# Patient Record
Sex: Female | Born: 1940 | Race: White | Hispanic: No
Health system: Southern US, Community
[De-identification: ages and names within clinical notes are randomized; demographics above are authoritative.]

---

## 2009-01-31 ENCOUNTER — Ambulatory Visit: Payer: Self-pay | Admitting: Family Medicine

## 2009-07-26 ENCOUNTER — Ambulatory Visit: Payer: Self-pay | Admitting: Family Medicine

## 2009-10-20 ENCOUNTER — Ambulatory Visit: Payer: Self-pay | Admitting: Orthopedic Surgery

## 2009-11-15 ENCOUNTER — Ambulatory Visit: Payer: Self-pay | Admitting: Vascular Surgery

## 2009-11-22 ENCOUNTER — Inpatient Hospital Stay: Payer: Self-pay | Admitting: Vascular Surgery

## 2010-01-04 ENCOUNTER — Ambulatory Visit: Payer: Self-pay | Admitting: Vascular Surgery

## 2010-02-06 ENCOUNTER — Ambulatory Visit: Payer: Self-pay | Admitting: Vascular Surgery

## 2010-02-21 ENCOUNTER — Ambulatory Visit: Payer: Self-pay | Admitting: Vascular Surgery

## 2010-03-01 ENCOUNTER — Ambulatory Visit: Payer: Self-pay | Admitting: Vascular Surgery

## 2010-03-08 ENCOUNTER — Ambulatory Visit: Payer: Self-pay | Admitting: Vascular Surgery

## 2010-03-10 LAB — PATHOLOGY REPORT

## 2010-04-10 ENCOUNTER — Ambulatory Visit: Payer: Self-pay | Admitting: Family Medicine

## 2010-04-14 ENCOUNTER — Ambulatory Visit: Payer: Self-pay | Admitting: Vascular Surgery

## 2010-05-17 ENCOUNTER — Other Ambulatory Visit: Payer: Self-pay | Admitting: Nephrology

## 2010-08-22 ENCOUNTER — Other Ambulatory Visit: Payer: Self-pay | Admitting: Nephrology

## 2010-10-07 ENCOUNTER — Emergency Department: Payer: Self-pay | Admitting: Emergency Medicine

## 2010-10-25 ENCOUNTER — Other Ambulatory Visit: Payer: Self-pay | Admitting: Orthopedic Surgery

## 2011-10-08 ENCOUNTER — Ambulatory Visit: Payer: Self-pay

## 2012-01-30 ENCOUNTER — Other Ambulatory Visit: Payer: Self-pay | Admitting: Nephrology

## 2012-01-30 LAB — RENAL FUNCTION PANEL
BUN: 13 mg/dL (ref 7–18)
Calcium, Total: 8.3 mg/dL — ABNORMAL LOW (ref 8.5–10.1)
EGFR (African American): 36 — ABNORMAL LOW
Glucose: 87 mg/dL (ref 65–99)
Osmolality: 281 (ref 275–301)
Potassium: 5 mmol/L (ref 3.5–5.1)
Sodium: 141 mmol/L (ref 136–145)

## 2012-01-30 LAB — CBC WITH DIFFERENTIAL/PLATELET
Basophil %: 1.3 %
Eosinophil #: 0.5 10*3/uL (ref 0.0–0.7)
Eosinophil %: 8 %
HCT: 35.1 % (ref 35.0–47.0)
HGB: 11.3 g/dL — ABNORMAL LOW (ref 12.0–16.0)
Lymphocyte #: 1.2 10*3/uL (ref 1.0–3.6)
MCH: 29.1 pg (ref 26.0–34.0)
Neutrophil #: 3.4 10*3/uL (ref 1.4–6.5)
Platelet: 227 10*3/uL (ref 150–440)
RBC: 3.89 10*6/uL (ref 3.80–5.20)
RDW: 14.8 % — ABNORMAL HIGH (ref 11.5–14.5)
WBC: 5.7 10*3/uL (ref 3.6–11.0)

## 2012-01-30 LAB — URINALYSIS, COMPLETE
Bacteria: NONE SEEN
Blood: NEGATIVE
Ketone: NEGATIVE
Ph: 6 (ref 4.5–8.0)
Squamous Epithelial: <1
WBC UR: NONE SEEN /HPF (ref 0–5)

## 2012-01-30 LAB — PROTEIN / CREATININE RATIO, URINE: Creatinine, Urine: 45.5 mg/dL (ref 30.0–125.0)

## 2012-02-21 ENCOUNTER — Inpatient Hospital Stay: Payer: Self-pay | Admitting: Internal Medicine

## 2012-02-21 LAB — COMPREHENSIVE METABOLIC PANEL
Albumin: 3.1 g/dL — ABNORMAL LOW (ref 3.4–5.0)
Anion Gap: 6 — ABNORMAL LOW (ref 7–16)
BUN: 13 mg/dL (ref 7–18)
Calcium, Total: 8.6 mg/dL (ref 8.5–10.1)
Co2: 33 mmol/L — ABNORMAL HIGH (ref 21–32)
Creatinine: 1.22 mg/dL (ref 0.60–1.30)
EGFR (African American): 52 — ABNORMAL LOW
EGFR (Non-African Amer.): 45 — ABNORMAL LOW
Glucose: 120 mg/dL — ABNORMAL HIGH (ref 65–99)
Osmolality: 281 (ref 275–301)
SGOT(AST): 25 U/L (ref 15–37)
SGPT (ALT): 21 U/L
Sodium: 140 mmol/L (ref 136–145)

## 2012-02-21 LAB — APTT: Activated PTT: 39.9 secs — ABNORMAL HIGH (ref 23.6–35.9)

## 2012-02-21 LAB — CK TOTAL AND CKMB (NOT AT ARMC): CK-MB: 7.5 ng/mL — ABNORMAL HIGH (ref 0.5–3.6)

## 2012-02-21 LAB — CBC
HGB: 12 g/dL (ref 12.0–16.0)
RBC: 4.09 10*6/uL (ref 3.80–5.20)

## 2012-02-21 LAB — TSH: Thyroid Stimulating Horm: 2.6 u[IU]/mL

## 2012-02-21 LAB — TROPONIN I
Troponin-I: 0.89 ng/mL — ABNORMAL HIGH
Troponin-I: 1.5 ng/mL — ABNORMAL HIGH

## 2012-02-21 LAB — PROTIME-INR
INR: 1.1
Prothrombin Time: 14.2 secs (ref 11.5–14.7)

## 2012-02-21 LAB — PRO B NATRIURETIC PEPTIDE: B-Type Natriuretic Peptide: 1344 pg/mL — ABNORMAL HIGH (ref 0–125)

## 2012-02-22 LAB — CBC WITH DIFFERENTIAL/PLATELET
Basophil #: 0.1 10*3/uL (ref 0.0–0.1)
Eosinophil #: 0.1 10*3/uL (ref 0.0–0.7)
HGB: 10.6 g/dL — ABNORMAL LOW (ref 12.0–16.0)
Lymphocyte #: 0.7 10*3/uL — ABNORMAL LOW (ref 1.0–3.6)
MCH: 29.2 pg (ref 26.0–34.0)
MCHC: 32.9 g/dL (ref 32.0–36.0)
Monocyte %: 9.1 %
Neutrophil %: 77.9 %
RBC: 3.63 10*6/uL — ABNORMAL LOW (ref 3.80–5.20)
RDW: 14.8 % — ABNORMAL HIGH (ref 11.5–14.5)
WBC: 6.7 10*3/uL (ref 3.6–11.0)

## 2012-02-22 LAB — LIPID PANEL
Cholesterol: 129 mg/dL (ref 0–200)
Ldl Cholesterol, Calc: 58 mg/dL (ref 0–100)
Triglycerides: 40 mg/dL (ref 0–200)

## 2012-02-22 LAB — APTT: Activated PTT: >160 secs (ref 23.6–35.9)

## 2012-02-22 LAB — BASIC METABOLIC PANEL
Calcium, Total: 8.1 mg/dL — ABNORMAL LOW (ref 8.5–10.1)
Creatinine: 1.28 mg/dL (ref 0.60–1.30)

## 2012-02-22 LAB — MAGNESIUM: Magnesium: 2 mg/dL

## 2012-02-23 ENCOUNTER — Ambulatory Visit: Payer: Self-pay | Admitting: Internal Medicine

## 2012-02-23 LAB — BASIC METABOLIC PANEL
EGFR (African American): 52 — ABNORMAL LOW
EGFR (Non-African Amer.): 45 — ABNORMAL LOW
Glucose: 80 mg/dL (ref 65–99)
Sodium: 140 mmol/L (ref 136–145)

## 2012-02-25 ENCOUNTER — Ambulatory Visit: Payer: Self-pay | Admitting: Internal Medicine

## 2012-02-26 LAB — PROTIME-INR: INR: 1.1

## 2012-03-03 ENCOUNTER — Ambulatory Visit: Payer: Self-pay | Admitting: Internal Medicine

## 2012-03-06 LAB — PATHOLOGY REPORT

## 2012-03-27 ENCOUNTER — Ambulatory Visit: Payer: Self-pay | Admitting: Internal Medicine

## 2012-04-13 ENCOUNTER — Emergency Department: Payer: Self-pay | Admitting: Emergency Medicine

## 2012-04-13 LAB — CBC
HCT: 32.9 % — ABNORMAL LOW (ref 35.0–47.0)
HGB: 10.7 g/dL — ABNORMAL LOW (ref 12.0–16.0)
MCV: 89 fL (ref 80–100)
Platelet: 223 10*3/uL (ref 150–440)
RBC: 3.7 10*6/uL — ABNORMAL LOW (ref 3.80–5.20)
RDW: 15 % — ABNORMAL HIGH (ref 11.5–14.5)
WBC: 7.4 10*3/uL (ref 3.6–11.0)

## 2012-04-13 LAB — COMPREHENSIVE METABOLIC PANEL
Albumin: 2.9 g/dL — ABNORMAL LOW (ref 3.4–5.0)
Alkaline Phosphatase: 100 U/L (ref 50–136)
BUN: 19 mg/dL — ABNORMAL HIGH (ref 7–18)
Calcium, Total: 8.5 mg/dL (ref 8.5–10.1)
Co2: 37 mmol/L — ABNORMAL HIGH (ref 21–32)
SGOT(AST): 22 U/L (ref 15–37)
SGPT (ALT): 16 U/L (ref 12–78)
Sodium: 140 mmol/L (ref 136–145)

## 2012-04-13 LAB — CK TOTAL AND CKMB (NOT AT ARMC): CK-MB: 3.4 ng/mL (ref 0.5–3.6)

## 2012-04-15 LAB — CBC CANCER CENTER
Basophil #: 0 x10 3/mm (ref 0.0–0.1)
Eosinophil #: 0.2 x10 3/mm (ref 0.0–0.7)
HCT: 33.1 % — ABNORMAL LOW (ref 35.0–47.0)
Lymphocyte #: 0.6 x10 3/mm — ABNORMAL LOW (ref 1.0–3.6)
MCH: 28.8 pg (ref 26.0–34.0)
MCHC: 32.1 g/dL (ref 32.0–36.0)
MCV: 90 fL (ref 80–100)
Neutrophil #: 7.1 x10 3/mm — ABNORMAL HIGH (ref 1.4–6.5)
Neutrophil %: 84.3 %
RBC: 3.7 10*6/uL — ABNORMAL LOW (ref 3.80–5.20)
RDW: 14.9 % — ABNORMAL HIGH (ref 11.5–14.5)

## 2012-04-22 LAB — CBC CANCER CENTER
Eosinophil #: 0.2 x10 3/mm (ref 0.0–0.7)
HCT: 33.3 % — ABNORMAL LOW (ref 35.0–47.0)
MCH: 29 pg (ref 26.0–34.0)
MCHC: 32.7 g/dL (ref 32.0–36.0)
MCV: 89 fL (ref 80–100)
Monocyte #: 0.5 x10 3/mm (ref 0.2–0.9)
Monocyte %: 8.6 %
RDW: 14.9 % — ABNORMAL HIGH (ref 11.5–14.5)

## 2012-04-27 ENCOUNTER — Ambulatory Visit: Payer: Self-pay | Admitting: Internal Medicine

## 2012-04-29 LAB — CBC CANCER CENTER
Basophil %: 1.1 %
Eosinophil #: 0.2 x10 3/mm (ref 0.0–0.7)
Eosinophil %: 3.4 %
HCT: 33.8 % — ABNORMAL LOW (ref 35.0–47.0)
HGB: 11.1 g/dL — ABNORMAL LOW (ref 12.0–16.0)
Lymphocyte #: 0.5 x10 3/mm — ABNORMAL LOW (ref 1.0–3.6)
MCH: 29.1 pg (ref 26.0–34.0)
MCV: 89 fL (ref 80–100)
Monocyte #: 0.5 x10 3/mm (ref 0.2–0.9)
Neutrophil #: 4 x10 3/mm (ref 1.4–6.5)
RBC: 3.82 10*6/uL (ref 3.80–5.20)

## 2012-05-06 LAB — CBC CANCER CENTER
Basophil %: 1 %
Eosinophil #: 0.3 x10 3/mm (ref 0.0–0.7)
HCT: 33.1 % — ABNORMAL LOW (ref 35.0–47.0)
HGB: 10.6 g/dL — ABNORMAL LOW (ref 12.0–16.0)
Lymphocyte %: 8.7 %
MCHC: 32.1 g/dL (ref 32.0–36.0)
Monocyte %: 11.6 %
Neutrophil #: 4.3 x10 3/mm (ref 1.4–6.5)
Neutrophil %: 74.2 %
Platelet: 230 x10 3/mm (ref 150–440)
RBC: 3.72 10*6/uL — ABNORMAL LOW (ref 3.80–5.20)
WBC: 5.8 x10 3/mm (ref 3.6–11.0)

## 2012-05-13 LAB — CBC CANCER CENTER
Basophil %: 0.8 %
Eosinophil #: 0.3 x10 3/mm (ref 0.0–0.7)
Eosinophil %: 4.9 %
HGB: 11.4 g/dL — ABNORMAL LOW (ref 12.0–16.0)
Lymphocyte %: 10.2 %
MCH: 29.4 pg (ref 26.0–34.0)
MCHC: 33 g/dL (ref 32.0–36.0)
MCV: 89 fL (ref 80–100)
Monocyte %: 10.5 %
Neutrophil %: 73.6 %
Platelet: 243 x10 3/mm (ref 150–440)
RBC: 3.89 10*6/uL (ref 3.80–5.20)
RDW: 15.1 % — ABNORMAL HIGH (ref 11.5–14.5)

## 2012-05-20 LAB — CBC CANCER CENTER
Basophil #: 0.1 x10 3/mm (ref 0.0–0.1)
HGB: 11.1 g/dL — ABNORMAL LOW (ref 12.0–16.0)
Lymphocyte #: 0.5 x10 3/mm — ABNORMAL LOW (ref 1.0–3.6)
MCH: 28.1 pg (ref 26.0–34.0)
MCV: 89 fL (ref 80–100)
Monocyte #: 0.6 x10 3/mm (ref 0.2–0.9)
Monocyte %: 13.8 %
Neutrophil %: 69.7 %
Platelet: 236 x10 3/mm (ref 150–440)
RBC: 3.95 10*6/uL (ref 3.80–5.20)
RDW: 15.1 % — ABNORMAL HIGH (ref 11.5–14.5)
WBC: 4.6 x10 3/mm (ref 3.6–11.0)

## 2012-05-27 ENCOUNTER — Ambulatory Visit: Payer: Self-pay | Admitting: Internal Medicine

## 2012-05-27 LAB — CBC CANCER CENTER
Basophil %: 1.3 %
Eosinophil %: 6.2 %
HCT: 35.3 % (ref 35.0–47.0)
HGB: 11.1 g/dL — ABNORMAL LOW (ref 12.0–16.0)
Lymphocyte #: 0.5 x10 3/mm — ABNORMAL LOW (ref 1.0–3.6)
Lymphocyte %: 11.2 %
MCH: 28.3 pg (ref 26.0–34.0)
MCV: 90 fL (ref 80–100)
Monocyte %: 14 %
Neutrophil #: 2.7 x10 3/mm (ref 1.4–6.5)
RBC: 3.94 10*6/uL (ref 3.80–5.20)
WBC: 4.1 x10 3/mm (ref 3.6–11.0)

## 2012-06-27 ENCOUNTER — Ambulatory Visit: Payer: Self-pay | Admitting: Internal Medicine

## 2012-07-27 ENCOUNTER — Ambulatory Visit: Payer: Self-pay | Admitting: Internal Medicine

## 2012-07-30 ENCOUNTER — Ambulatory Visit: Payer: Self-pay | Admitting: Internal Medicine

## 2012-07-30 LAB — PLATELET COUNT: Platelet: 208 10*3/uL (ref 150–440)

## 2012-08-04 ENCOUNTER — Ambulatory Visit: Payer: Self-pay | Admitting: Internal Medicine

## 2012-08-08 ENCOUNTER — Inpatient Hospital Stay: Payer: Self-pay | Admitting: Internal Medicine

## 2012-08-08 LAB — CK TOTAL AND CKMB (NOT AT ARMC)
CK, Total: 108 U/L (ref 21–215)
CK-MB: 2.6 ng/mL (ref 0.5–3.6)

## 2012-08-08 LAB — BASIC METABOLIC PANEL
Anion Gap: 3 — ABNORMAL LOW (ref 7–16)
BUN: 19 mg/dL — ABNORMAL HIGH (ref 7–18)
Chloride: 102 mmol/L (ref 98–107)
EGFR (African American): 46 — ABNORMAL LOW
EGFR (Non-African Amer.): 39 — ABNORMAL LOW
Glucose: 98 mg/dL (ref 65–99)
Osmolality: 276 (ref 275–301)
Potassium: 4.6 mmol/L (ref 3.5–5.1)
Sodium: 137 mmol/L (ref 136–145)

## 2012-08-08 LAB — CBC
HCT: 33.5 % — ABNORMAL LOW (ref 35.0–47.0)
HGB: 10.7 g/dL — ABNORMAL LOW (ref 12.0–16.0)
MCV: 87 fL (ref 80–100)
RBC: 3.87 10*6/uL (ref 3.80–5.20)
RDW: 14.9 % — ABNORMAL HIGH (ref 11.5–14.5)
WBC: 7.3 10*3/uL (ref 3.6–11.0)

## 2012-08-08 LAB — TROPONIN I: Troponin-I: 0.06 ng/mL — ABNORMAL HIGH

## 2012-08-09 LAB — BASIC METABOLIC PANEL
Anion Gap: 4 — ABNORMAL LOW (ref 7–16)
BUN: 22 mg/dL — ABNORMAL HIGH (ref 7–18)
Co2: 32 mmol/L (ref 21–32)
Creatinine: 1.71 mg/dL — ABNORMAL HIGH (ref 0.60–1.30)
EGFR (Non-African Amer.): 30 — ABNORMAL LOW
Glucose: 128 mg/dL — ABNORMAL HIGH (ref 65–99)
Potassium: 5.4 mmol/L — ABNORMAL HIGH (ref 3.5–5.1)
Sodium: 138 mmol/L (ref 136–145)

## 2012-08-09 LAB — CBC WITH DIFFERENTIAL/PLATELET
Basophil #: 0.1 10*3/uL (ref 0.0–0.1)
Basophil %: 0.6 %
Eosinophil %: 0 %
HCT: 31.6 % — ABNORMAL LOW (ref 35.0–47.0)
HGB: 10.4 g/dL — ABNORMAL LOW (ref 12.0–16.0)
MCH: 29.1 pg (ref 26.0–34.0)
MCHC: 33.1 g/dL (ref 32.0–36.0)
MCV: 88 fL (ref 80–100)
Monocyte #: 0.6 x10 3/mm (ref 0.2–0.9)
Monocyte %: 5.3 %
Neutrophil #: 9.6 10*3/uL — ABNORMAL HIGH (ref 1.4–6.5)
Neutrophil %: 92.5 %
RDW: 14.9 % — ABNORMAL HIGH (ref 11.5–14.5)

## 2012-08-09 LAB — CK TOTAL AND CKMB (NOT AT ARMC): CK-MB: 4.1 ng/mL — ABNORMAL HIGH (ref 0.5–3.6)

## 2012-08-10 LAB — BASIC METABOLIC PANEL
Anion Gap: 5 — ABNORMAL LOW (ref 7–16)
EGFR (African American): 35 — ABNORMAL LOW
EGFR (Non-African Amer.): 30 — ABNORMAL LOW
Glucose: 140 mg/dL — ABNORMAL HIGH (ref 65–99)

## 2012-08-11 LAB — BASIC METABOLIC PANEL
Anion Gap: 4 — ABNORMAL LOW (ref 7–16)
Calcium, Total: 9.3 mg/dL (ref 8.5–10.1)
Chloride: 107 mmol/L (ref 98–107)
Co2: 30 mmol/L (ref 21–32)
Creatinine: 1.9 mg/dL — ABNORMAL HIGH (ref 0.60–1.30)
EGFR (Non-African Amer.): 26 — ABNORMAL LOW
Potassium: 6 mmol/L — ABNORMAL HIGH (ref 3.5–5.1)
Sodium: 141 mmol/L (ref 136–145)

## 2012-08-12 LAB — BASIC METABOLIC PANEL
Anion Gap: 7 (ref 7–16)
BUN: 30 mg/dL — ABNORMAL HIGH (ref 7–18)
Creatinine: 1.66 mg/dL — ABNORMAL HIGH (ref 0.60–1.30)
EGFR (African American): 36 — ABNORMAL LOW
EGFR (Non-African Amer.): 31 — ABNORMAL LOW
Glucose: 102 mg/dL — ABNORMAL HIGH (ref 65–99)

## 2012-08-12 LAB — CBC WITH DIFFERENTIAL/PLATELET
Basophil %: 0.1 %
Eosinophil %: 0 %
HGB: 9.4 g/dL — ABNORMAL LOW (ref 12.0–16.0)
Lymphocyte #: 0.1 10*3/uL — ABNORMAL LOW (ref 1.0–3.6)
MCH: 27.8 pg (ref 26.0–34.0)
MCHC: 32 g/dL (ref 32.0–36.0)
MCV: 87 fL (ref 80–100)
Monocyte #: 0.5 x10 3/mm (ref 0.2–0.9)
Monocyte %: 5 %
Neutrophil #: 8.5 10*3/uL — ABNORMAL HIGH (ref 1.4–6.5)
Neutrophil %: 93.6 %
Platelet: 231 10*3/uL (ref 150–440)
RBC: 3.36 10*6/uL — ABNORMAL LOW (ref 3.80–5.20)
WBC: 9.1 10*3/uL (ref 3.6–11.0)

## 2012-08-12 LAB — PHOSPHORUS: Phosphorus: 3.4 mg/dL (ref 2.5–4.9)

## 2012-08-13 LAB — BASIC METABOLIC PANEL
Anion Gap: 7 (ref 7–16)
BUN: 31 mg/dL — ABNORMAL HIGH (ref 7–18)
Chloride: 101 mmol/L (ref 98–107)
Co2: 34 mmol/L — ABNORMAL HIGH (ref 21–32)
Osmolality: 290 (ref 275–301)
Sodium: 142 mmol/L (ref 136–145)

## 2012-08-13 LAB — EXPECTORATED SPUTUM ASSESSMENT W GRAM STAIN, RFLX TO RESP C

## 2012-08-14 LAB — BASIC METABOLIC PANEL
Anion Gap: 8 (ref 7–16)
BUN: 34 mg/dL — ABNORMAL HIGH (ref 7–18)
Calcium, Total: 8.8 mg/dL (ref 8.5–10.1)
Co2: 33 mmol/L — ABNORMAL HIGH (ref 21–32)
Creatinine: 1.81 mg/dL — ABNORMAL HIGH (ref 0.60–1.30)
EGFR (African American): 32 — ABNORMAL LOW
EGFR (Non-African Amer.): 28 — ABNORMAL LOW
Glucose: 141 mg/dL — ABNORMAL HIGH (ref 65–99)
Potassium: 3.5 mmol/L (ref 3.5–5.1)
Sodium: 137 mmol/L (ref 136–145)

## 2012-08-14 LAB — CULTURE, BLOOD (SINGLE)

## 2012-08-15 LAB — BASIC METABOLIC PANEL
Anion Gap: 8 (ref 7–16)
BUN: 35 mg/dL — ABNORMAL HIGH (ref 7–18)
Chloride: 96 mmol/L — ABNORMAL LOW (ref 98–107)
Co2: 34 mmol/L — ABNORMAL HIGH (ref 21–32)
Creatinine: 1.73 mg/dL — ABNORMAL HIGH (ref 0.60–1.30)
EGFR (Non-African Amer.): 30 — ABNORMAL LOW
Potassium: 4.1 mmol/L (ref 3.5–5.1)

## 2012-08-16 LAB — BASIC METABOLIC PANEL
Anion Gap: 6 — ABNORMAL LOW (ref 7–16)
Calcium, Total: 8.5 mg/dL (ref 8.5–10.1)
Co2: 39 mmol/L — ABNORMAL HIGH (ref 21–32)
EGFR (African American): 32 — ABNORMAL LOW
EGFR (Non-African Amer.): 27 — ABNORMAL LOW
Glucose: 107 mg/dL — ABNORMAL HIGH (ref 65–99)
Osmolality: 280 (ref 275–301)
Sodium: 136 mmol/L (ref 136–145)

## 2012-08-17 LAB — CBC WITH DIFFERENTIAL/PLATELET
Basophil #: 0 10*3/uL (ref 0.0–0.1)
Eosinophil #: 0 10*3/uL (ref 0.0–0.7)
Lymphocyte %: 1.3 %
MCH: 27.2 pg (ref 26.0–34.0)
MCHC: 31.7 g/dL — ABNORMAL LOW (ref 32.0–36.0)
Monocyte #: 0.3 x10 3/mm (ref 0.2–0.9)
Neutrophil %: 95.6 %
RBC: 3.93 10*6/uL (ref 3.80–5.20)
RDW: 15.1 % — ABNORMAL HIGH (ref 11.5–14.5)
WBC: 9.9 10*3/uL (ref 3.6–11.0)

## 2012-08-17 LAB — BASIC METABOLIC PANEL
Anion Gap: 5 — ABNORMAL LOW (ref 7–16)
BUN: 37 mg/dL — ABNORMAL HIGH (ref 7–18)
Chloride: 92 mmol/L — ABNORMAL LOW (ref 98–107)
Creatinine: 1.88 mg/dL — ABNORMAL HIGH (ref 0.60–1.30)
EGFR (Non-African Amer.): 27 — ABNORMAL LOW
Osmolality: 286 (ref 275–301)
Potassium: 3.8 mmol/L (ref 3.5–5.1)
Sodium: 137 mmol/L (ref 136–145)

## 2012-08-19 LAB — BASIC METABOLIC PANEL
BUN: 39 mg/dL — ABNORMAL HIGH (ref 7–18)
Calcium, Total: 8.4 mg/dL — ABNORMAL LOW (ref 8.5–10.1)
Chloride: 91 mmol/L — ABNORMAL LOW (ref 98–107)
Co2: 39 mmol/L — ABNORMAL HIGH (ref 21–32)
Creatinine: 1.89 mg/dL — ABNORMAL HIGH (ref 0.60–1.30)
EGFR (African American): 31 — ABNORMAL LOW
Glucose: 173 mg/dL — ABNORMAL HIGH (ref 65–99)
Potassium: 3.5 mmol/L (ref 3.5–5.1)
Sodium: 135 mmol/L — ABNORMAL LOW (ref 136–145)

## 2012-08-21 LAB — CREATININE, SERUM
EGFR (African American): 37 — ABNORMAL LOW
EGFR (Non-African Amer.): 32 — ABNORMAL LOW

## 2012-08-22 LAB — PATHOLOGY REPORT

## 2012-08-27 ENCOUNTER — Ambulatory Visit: Payer: Self-pay | Admitting: Internal Medicine

## 2012-09-27 DEATH — deceased

## 2014-06-13 IMAGING — CT NM PET TUM IMG LTD AREA
1 of 5 series · 8 of 25 positions shown · non-contrast
Comparison: none

REASON FOR EXAM: right lung nodule
COMMENTS:

[Series 3: ct wb 3.0 b30f · axial · 3.0mm · 0.98mm/px · z∈[-1282,-606]mm · 8 of 433 slices shown]
[im 49/433  soft-tissue]
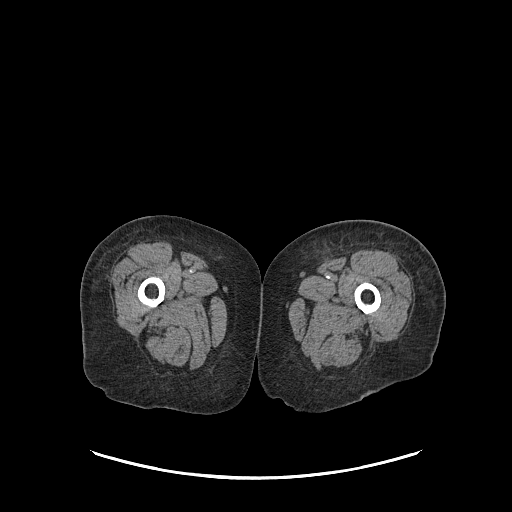
[im 97/433  soft-tissue]
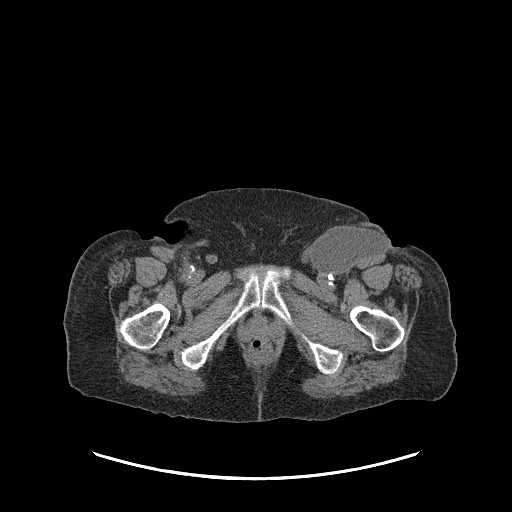
[im 145/433  soft-tissue]
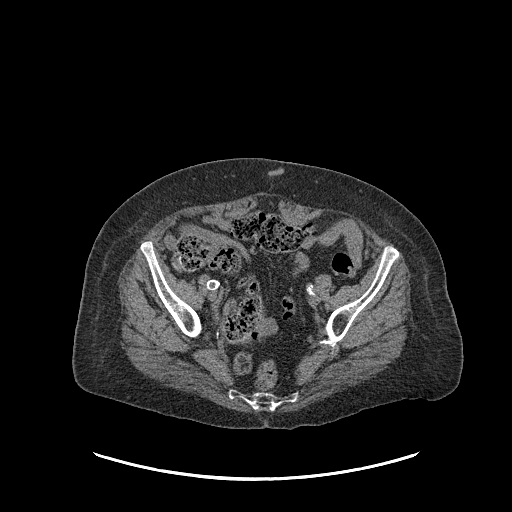
[im 193/433  soft-tissue]
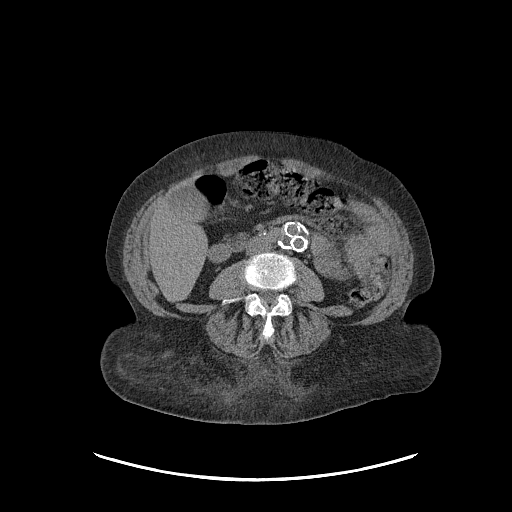
[im 241/433  soft-tissue]
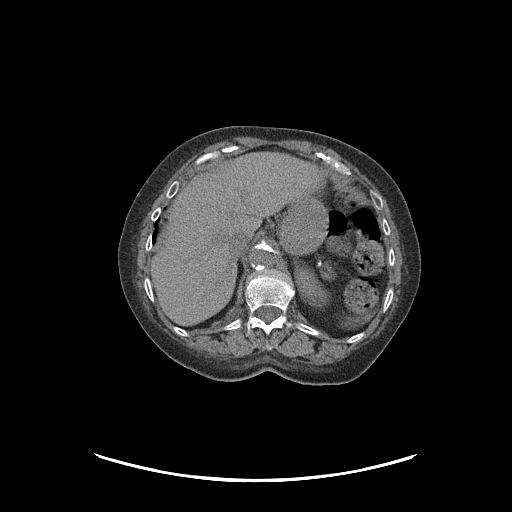
[im 289/433  soft-tissue]
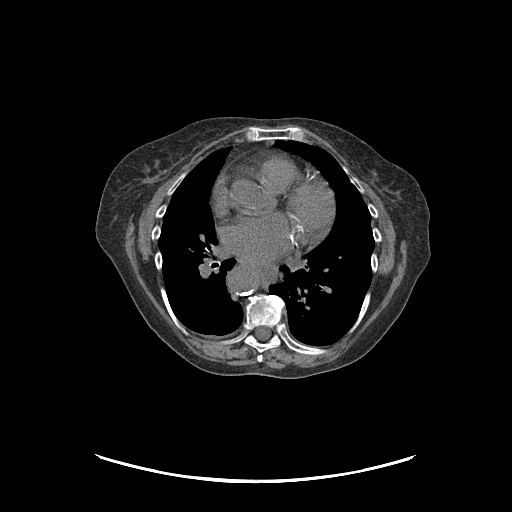
[im 337/433  soft-tissue]
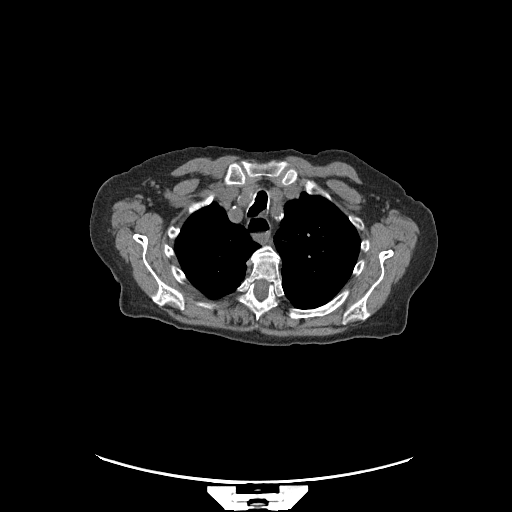
[im 385/433  soft-tissue]
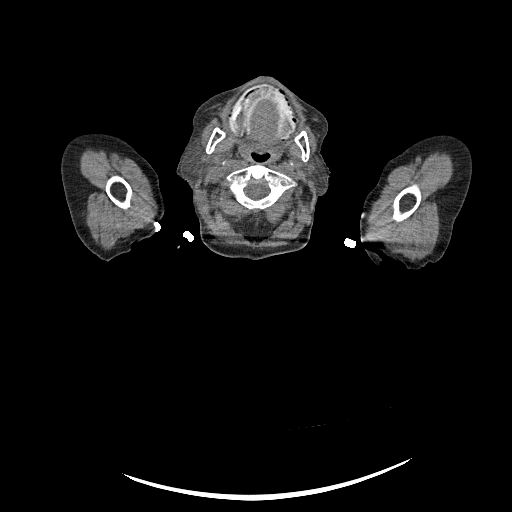

[8 of 25 positions shown; findings below may reference images not displayed]

PROCEDURE:     PET - PET/CT DX LUNG CA  - February 25, 2012 [DATE]

RESULT:

PROCEDURE:  Total body PET was performed in conjunction with a nonattenuated
CT status post right wrist injection of 12.15 mCi of F18-FDG.

The initial injection time was at [DATE], scan start time of [DATE] and
scan end time at [DATE].

Contamination on the patient's gown occurred during initial injection.
Delayed imaging was obtained status post removal of the contamination.

Scan start time was at [DATE] and scan end time at [DATE].

The patient's fasting blood glucose was 91 mg/dl.
FINDINGS: Appropriate bio-distribution is appreciated in the region of the
base of the brain, heart, liver, spleen, left kidney and urinary bladder.

There is minimal to no uptake in the region of the right kidney which is
severely atrophic.

A focal area of FDG avid hypermetabolic activity projects within the soft
tissue appearing density projecting either anteriorly within the superior
segment of the right lower lobe or possibly within the lateral segment of
the right middle lobe, with a SVU of 2.11. No further areas of abnormal
hypermetabolic activity are identified.
IMPRESSION: FDG advid nodular density within the right lung.
Differentialial considerations are neoplastic disease in the absence of
known infection or inflammatory processes.

## 2014-12-14 NOTE — Consult Note (Signed)
ONCOLOGY followup note -  HPI: weak, still has dyspnea at rest, on high flow O2.  Still having sputum mixed with blood. ROS: cough, mild right lower chest pain on coughing.  No fever today. Exam: weak, mildly tachypneic on speaking.  Otherwise no acute distress             vitals - 98, 87, 20, 154/75, 84-88% on high-flow oxygen             lungs - bilateral diminished breath sounds, more so in the right lower lobe             abd - mildly distended, soft, nontender Labs: creatinine 1.9, calcium 9.3. On 12/14, hemoglobin 10.4, WBC 10.4, platelets 219.  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  Agree with ongoing treatment for pneumonia and COPD, oxygen, and see if her condition improves in which case we can then pursue PET scan evaluation for staging.  Continue to monitor hemoptysis, if it worsens, will consider consulting radiation oncologist.  Will continue to follow.  Have discussed code status, at this time patient wants to remain full code.  Patient explained above, agreeable to this plan.  Electronic Signatures: Izola PricePandit, Tashiya Souders Raj (MD)  (Signed on 16-Dec-13 14:42)  Authored  Last Updated: 16-Dec-13 14:42 by Izola PricePandit, Harlon Kutner Raj (MD)

## 2014-12-14 NOTE — Consult Note (Signed)
Brief Consult Note: Diagnosis: CA LUNG, EXACCERBATION OF COPD, PNEUMONIA, CRF.   Patient was seen by consultant.   Consult note dictated.   Discussed with Attending MD.   Comments: PATIENT SEEN EVENING 12/15, NOTE DELAYED UNTIL TODAY, SEE ALSO DICTATED NOTE.  RE CA LUNG, PATIENT IS S/P XRT TO RUL NODULE FOR STAGE 1 CANCER, NOW HAS PERSISTENT HEMOPTYSIS AND NEW RLL NODULE. A PET/CT HAS BEEN PLANNED, LATER A BX  PLANNED.   SUGGEST.Eulah Citizen. WOULD CANCEL TOMORROWS PET/CT  PATIENT WILL NOT TOLERATE DUE TO RESP DISTRESS.  WOULD ALSO D/C ASA DUE TO HEMOPTYSIS.  CAN CONTINUE HEPARIN AS HIGH RISK CLOT BUT DIE TO RENAL FAILURE TID DOSE TOO HIGH, WOULD REDUCE TO Q 12H.  Electronic Signatures: Marin RobertsGittin, Hildreth Robart G (MD)  (Signed 15-Dec-13 12:04)  Authored: Brief Consult Note   Last Updated: 15-Dec-13 12:04 by Marin RobertsGittin, Lakendria Nicastro G (MD)

## 2014-12-14 NOTE — Consult Note (Signed)
ONCOLOGY followup - still weak and resting in bed, has dyspnea at rest, on high flow O2. Hemoptysis is much better.denies pain issues.  No fever today.weak, tachypneic on speaking.  Otherwise no acute distress            vitals - afebrile, stable, 93% on high-flow oxygen            lungs - bilateral diminished breath sounds, more so in the right lower lobe            abd - mildly distended, soft, nontendercreatinine 1.68. 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      Currently in with right lower lobe pneumonia/atelectasis, COPD exacerbation. Hemoptysis is better, still has remarkable dyspnea and weakness. Agree with ongoing treatment for pneumonia and COPD, oxygen, and see if her condition improves in which case we can then pursue PET scan evaluation for staging lung cancer. Also she is not a good candidate for any treatment for cancer unless her condition gets better. Continue to monitor for hemoptysis, if it worsens, consult radiation oncologist. Overall prognosis seems poor, Palliative care is now following. Will continue to follow intermittently. If discharged home will followup as outpatient.    Patient explained above, agreeable to this plan.  Electronic Signatures: Izola PricePandit, Andron Marrazzo Raj (MD)  (Signed on 19-Dec-13 00:14)  Authored  Last Updated: 19-Dec-13 00:14 by Izola PricePandit, Lucile Hillmann Raj (MD)

## 2014-12-14 NOTE — H&P (Signed)
PATIENT NAME:  Peggy [name], Peggy [name] MR#:  [id number] DATE OF BIRTH:  1941/07/20  DATE OF ADMISSION:  08/08/2012  PRIMARY ONCOLOGIST: Dr Sherrlyn HockPandit PRIMARY CARE PHYSICIAN: Dr Dossie Arbourrissman. PRIMARY CARDIOLOGY: Dr Juliann Paresallwood.  CHIEF COMPLAINT: Excruciating chest pain and shortness of breath.   HISTORY OF PRESENT ILLNESS: Very pleasant 74 year old female who was admitted June 2013 diagnosed with [name] non-ST elevation myocardial infarction. At that time, she underwent [name] cardiac catheterization which showed ostial RCA 100% stenosis. She also had an echo which was normal at that time and subsequently had [name] biopsy of her lung and was diagnosed with non-small cell carcinoma. She was brought in via EMS for excruciating chest pain and shortness of breath. In the emergency room, she was noted to have acute respiratory failure, was placed on BiPAP. Her chest pain was 10 out of 10.  She was describing as chest pain radiating to her back.  There was [name] high concern of dissection or some kind of aortic tear as the patient does have [name] history of aortic repair. After much discussion with the patient and the patient's family, due to her renal insufficiency, low GFR, it was decided by the family and the patient to go ahead and proceed with [name] CT scan to further evaluate for dissection versus pulmonary emboli. The patient has been under dialysis in the past and is well aware that she could undergo dialysis if getting contrast for the CT; however, she decided to undergo the CT. The CT did not show evidence of dissection. She is currently on [name] BiPAP with [name] slightly elevated troponin.   REVIEW OF SYSTEMS: CONSTITUTIONAL: She has had [name] low-grade fever and she has had fatigue and weakness status post XRT. EYES: She has occasional blurred vision, no double vision, no glaucoma. ENT: She hearing loss. No ear pain, epistaxis, discharge, snoring, redness of oropharynx. RESPIRATORY: Positive cough. Positive wheezing. Positive history of chronic obstructive  pulmonary disease, not on oxygen.  CARDIOVASCULAR: 10 out of 10 chest pain which has subsequently resolved. No orthopnea, edema, arrhythmia, palpitations, syncope.  GI: No nausea, vomiting, diarrhea, abdominal pain, melena, or ulcers. GU: No dysuria or hematuria. ENDOCRINE: No polyuria or polydipsia. HEME/LYMPH: No anemia or easy bruising.  SKIN: No rash or lesions. MUSCULOSKELETAL: No gout or pain in the shoulders or knees. NEUROLOGIC: No history of CVAs, transient ischemic attacks, seizures. PSYCHIATRY: She has no history of depression, anxiety.   PAST MEDICAL HISTORY:  1. Patient was admitted June 2013 with non-ST elevation myocardial infarction. She underwent [name] cardiac catheterization that showed [name] normal ejection fraction; however, she had over 100% ostial occlusion. [facility] and the cardiologist there according to the family recommended no further cardiac workup, just medical management for her ostial lesion.  2. 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 The patient actually underwent dialysis. She does have an AV fistula in the left arm.  3. Osteoarthritis. 4. Atrial fibrillation.  5. Peripheral vascular disease.    6. GERD.   7. Hypertension.   8. Chronic obstructive pulmonary disease, occasionally on home oxygen.  PAST SURGICAL HISTORY:  1. AV fistula left arm.  2. History of AAA repair.  3. Hemorrhoidectomy. 4. Cardiac stent.  5. Hysterectomy. 6. Appendectomy. 7. BTL.  8. Right toe amputation.    MEDICATIONS:  1. Tylenol 500 mg 1 to 2 tablets q.4 hours p.r.n. pain.  2. Aspirin 81 mg daily.  3. Tums 750 mg  2 tablets every 2 hours p.r.n.  4. Imdur 300 mg b.i.d.  5. Gabapentin 300 mg at bedtime.  6. Fluoxetine 40 mg daily.  7. Loperamide 2 mg p.r.n.  8. Cetirizine 10 mg daily.  9. Pravastatin 20 mg at bedtime.  10. Xanax 0.25 t.i.d. p.r.n.  11. Metoprolol 25, 1/2 tablet daily.   12. Albuterol inhaler q.6 hours.  13. ProAir 2 puffs q.6 hours.  14. Spiriva 18 mcg daily.  15. Symbicort 2 puffs b.i.d.  16. Dexamethasone eye drops 4 times [name] day as needed.  17. Nexium 40 mg daily.  18. Carbamide peroxide 6.5% optic solution 5 drops to each affected ear 3 times [name] day.  19. Hydralazine 50 mg t.i.d.  20. Nephro-Vite 1 tablet daily.   SOCIAL HISTORY: The patient continues to smoke although she says her last cigarette was yesterday. She smokes about [name] pack [name] day. She lives alone.    FAMILY HISTORY: Positive for coronary artery disease and diabetes.   ALLERGIES: No known drug allergies.   PHYSICAL EXAMINATION:   VITAL SIGNS: Temperature 98.2, pulse is 92, respirations 24, blood pressure 106/63, 92% on BiPAP.   GENERAL: The patient is sitting up. She is clenching her chest and appears to be in moderate distress.   HEENT: Head is atraumatic. Pupils are round. Sclerae anicteric. Mucous membranes were not examined due to patient being on BiPAP. Oropharynx is not examined either.   NECK: Supple. No appreciable thyromegaly.   CARDIOVASCULAR: Tachycardia. No murmurs, gallops, or rubs. PMI is not displaced.   LUNGS: Fair air movement. There is some mild wheezing, no rhonchi or crackles are heard.   BACK: No CVA or vertebral tenderness.  ABDOMEN: Bowel sounds positive, nontender, nondistended. No hepatosplenomegaly.   EXTREMITIES: No clubbing, cyanosis, or edema.  NEUROLOGIC: Cranial nerves 2 through 12 are grossly intact. No focal deficits.   SKIN: Without rash or lesions.   MUSCULOSKELETAL: 4 out of 5 strength in all extremities.   LABORATORIES: Sodium 137, potassium 4.6, chloride 102, bicarbonate 32, glucose is 98, BUN is 19, creatinine 1.37. BNP 2509. Troponin 0.06. White blood cell 7.3, hemoglobin 10.7, hematocrit 34, platelets are 220,000. D-dimer is 2.19. ABG 7.33, pCO2 is 61, pO2 is 337, FIO2 of 100, bicarbonate is 32.2. This is on nonrebreather.   CT scan  of the abdomen and pelvis showed no evidence of dissection; however, it does show [name] right lung mass, [name] small right pleural effusion, right lower lobe and right middle lobe atelectasis or infiltrate, fluid collection in the left groin.   EKG shows inverted T waves in lateral leads; however, this is unchanged from previous.   ASSESSMENT AND PLAN: [name] 74 year old female with recent diagnosis of non-small cell lung cancer status post radiation and non-ST elevation myocardial infarction in June 2013 with 100% ostial lesion who presented with excruciating chest pain, is currently chest pain free, and acute on chronic respiratory failure.  1. Acute on chronic respiratory failure secondary to pneumonia. Patient has recently had radiation so therefore for her pneumonia I will go ahead and treat with vancomycin and Zosyn. Blood cultures have been ordered. I also feel like given her history of chronic obstructive pulmonary disease I will treat with methylprednisone 60 mg IV q.8 hours as well as nebulizers to treat for possible chronic obstructive pulmonary disease exacerbation.  2. Chest pain. Questionable non-ST elevation myocardial infarction with slightly elevated troponin.  Patient will be admitted to telemetry. Continue cardiac enzymes.  I have not consulted Cardiology; however, if  it is evident that she has [name] non-ST elevation myocardial infarction we will need to consult Dr. Juliann Pares. I put her on Lovenox 1 mg/kg and will continue her outpatient medications including aspirin and metoprolol.  3. Acute renal insufficiency. The patient and the family was made well aware that the patient, if she received [name] CT scan to evaluate for dissection, that there is [name] risk of undergoing dialysis, as she has in the past. Once again, they were made aware of the risks of dialysis with contrast for the CT scan versus not knowing if the patient had an aortic dissection or aortic tear. As mentioned above in the HPI, the patient came in  with excruciating chest pain radiating to her back and there was strong concern for dissection versus [name] pulmonary emboli or acute myocardial infarction. However, before starting on heparin or Lovenox we needed to confirm that there was no dissection. But it was on the differential as the patient had classic symptoms of dissection. Patient and family decided on CAT scan. Due to the fact that she did receive [name] CT scan and she received some fluids prior, she will need to have close monitoring for her BMP. She will be continued on some IV fluids. Her ejection fraction was 55% in June 2013.  4. Smoking dependence. I encouraged the patient to stop smoking. At this time she says she will stop smoking. I am going to put [name] nicotine patch.  5. Hypertension. We will continue outpatient medications.   CODE STATUS: THE PATIENT IS FULL CODE STATUS.   TIME SPENT: Approximately 60 minutes.    ____________________________ Janyth Contes. Juliene Pina, MD spm:vtd D: 08/08/2012 19:21:46 ET T: 08/09/2012 06:39:56 ET JOB#: [id number]  cc: Angelica Frandsen P. Juliene Pina, MD, <Dictator> Steele Sizer, MD Janyth Contes Stassi Fadely MD ELECTRONICALLY SIGNED 08/11/2012 15:42

## 2014-12-14 NOTE — Consult Note (Signed)
Reason for Visit: This 74 year old Female patient presents to the clinic for initial evaluation of  Lung cancer .   Referred by Dr. Ma Hillock.  Diagnosis:   Chief Complaint/Diagnosis   74 year old female with stage I non-small cell lung cancer   Pathology Report Pathology report reviewed    Imaging Report PET/CT and CT scans reviewed    Referral Report Clinical notes reviewed    Planned Treatment Regimen Radiation therapy with curative intent    HPI   patient is a 74 year old female who has chronic dyspnea on exertion and is on nasal oxygen for the past 6 years.she was found to have a lung nodule the right midlung field. PET scan demonstrated this to be a positive lesion and she underwent a fine-needle aspiration which was positive for non-small cell lung cancer.she does have significant morbidities including COPD, Eulas Post disease and has been evaluated by surgical oncology and thought not to be a surgical candidate. Dr. Faith Rogue has evaluated the patient. She seen today for consideration of radiation therapy with curative intent. She is nasal oxygen dependent. She is having no cough or hemoptysis at this time.  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    Neurological/Psychiatric Alzheimer's; migraine    Past Surgical History appendectomy; Hysterectomy, hemorrhoidectomy, abdominal aortic aneurysm repair    Family  History positive    Family History Comments Family history positive for adult onset diabetes and cardiovascular heart disease    Social History positive    Social History Comments 100-pack-year smoking history   Allergies:   No Known Allergies:   Home Meds:  Home Medications: Medication Instructions Status  metoprolol 25 mg oral tablet, extended release 1 tab(s) orally once a day Active  Ascriptin Enteric 81 mg oral delayed release tablet 1 tab(s) orally once a day Active  ALPRAZolam 0.25 mg oral tablet 0.5 tab(s) orally once a day, As Needed anxiety Active  nicotine 14 mg/24 hr transdermal film, extended release 1 PATCH transdermal once a day Active  acetaminophen 325 mg oral tablet 2 tab(s) orally every 4 hours, As Needed, pain or temp. greater than 100.4 Active  EpiPen 2-Pak 0.3 mg injectable kit 1  injectable  as needed   Active  loratadine 10 mg oral tablet 1 tab(s) orally once a day Active  hydrALAZINE 50 mg oral tablet 1 tab(s) orally 3 times a day Active  isosorbide mononitrate 30 mg oral tablet, extended release 1 tab(s) orally once a day (in the morning) Active  bumetanide 0.5 mg oral tablet 1 tab(s) orally once a day, As Needed Active  potassium chloride 20 mEq oral tablet, extended release 1 tab(s) orally once a day, As Needed Active  ferrous gluconate 240 mg (27 mg elemental iron) oral tablet 1 tab(s) orally once a day Active  stool softener   once a day  Active  pravastatin 20 mg oral tablet 1 tab(s) orally once a day (at bedtime) Active  gabapentin 300 mg oral capsule 1 cap(s) orally once a day (at bedtime) Active  Nephro-Vite Vitamin B Complex with C and Folic Acid tablet 1 tab(s) orally once a day  Active  fluoxetine 20 mg capsule 1 cap(s) orally once a day  Active  Nexium 40 mg delayed release capsule 1 cap(s) orally 2 times a day  Active  Symbicort 160 mcg-4.5 mcg/inh aerosol with adapter 2 puff(s) inhaled 2 times a day  Active  Spiriva 18 mcg capsule 1 ea inhaled  once a day  Active  albuterol 2.5 mg/3 mL (0.083%) solution 3 mL inhaled 3 times a day  Active  Nasonex 50 mcg/inh spray 2 spray(s) nasal once a day (each nostril) Active   Review of Systems:   General negative    Performance Status (ECOG) 0    Skin negative    Breast negative    Ophthalmologic negative    ENMT negative    Respiratory and Thorax see HPI    Cardiovascular see HPI    Gastrointestinal negative    Genitourinary see HPI    Musculoskeletal negative    Neurological negative    Psychiatric see HPI    Hematology/Lymphatics negative    Endocrine negative    Allergic/Immunologic negative   Nursing Notes:  Nursing Vital Signs and Chemo Nursing Nursing Notes: *CC Vital Signs Flowsheet:   24-Jul-13 15:47   Temp Temperature 97.8   Pulse Pulse 65   SBP SBP 159   DBP DBP 68   Pain Scale (0-10)  0   Current Weight (kg) (kg) 63.5   Height (cm) centimeters 151.5   BSA (m2) 1.5   Physical Exam:  General/Skin/HEENT:   General normal    Skin normal    Eyes normal    ENMT normal    Head and Neck normal    Additional PE Well-developed elderly female in NAD height chronic nasal oxygen. Lungs are clear to A&P cardiac examination shows irregular rate and rhythm. Abdomen is benign with no organomegaly or masses noted. Abdomen is benign. No supraclavicular or cervical adenopathy is appreciated.   Breasts/Resp/CV/GI/GU:   Respiratory and Thorax normal    Cardiovascular normal    Gastrointestinal normal    Genitourinary normal   MS/Neuro/Psych/Lymph:   Musculoskeletal normal    Neurological normal    Lymphatics normal   Assessment and Plan:  Impression:   clinical stage I non-small cell lung cancer of the right midlung field in 74 year old female with significant medical comorbidities.  Plan:   at this time based on her COPD and nasal oxygen dependency would like to go ahead with IMRT radiation therapy to her lung. Would use maximum projection  intensity to track the movement of her tumor during treatment. Would treat up to 6000 cGy using IMRT treatment planning and delivery to spare her esophagus, spinal cord and normal lung volume. Risks and benefits of treatment were reviewed with the patient and she seems to comprehend her treatment plan well. I have set her up for CT simulation next week.  I would like to take this opportunity to thank you for allowing me to continue to participate in this patient's care.  CC Referral:   cc: Dr. Golden Pop   Electronic Signatures: Baruch Gouty Roda Shutters (MD)  (Signed 29-Jul-13 16:12)  Authored: HPI, Diagnosis, Past Hx, PFSH, Allergies, Home Meds, ROS, Nursing Notes, Physical Exam, Encounter Assessment and Plan, CC  Referring Physician   Last Updated: 29-Jul-13 16:12 by Armstead Peaks (MD)

## 2014-12-17 NOTE — Discharge Summary (Signed)
PATIENT NAME:  Peggy [name], Peggy [name] MR#:  [id number] DATE OF BIRTH:  August 30, 1940  DATE OF ADMISSION:  08/08/2012  DATE OF DISCHARGE:  08/21/2012  ADMITTING PHYSICIAN: Adrian SaranSital Mody, MD   DISCHARGING PHYSICIAN: Enid Baasadhika Takyla Kuchera, MD  PRIMARY CARE PHYSICIAN: Dr. Dossie Arbourrissman.   PRIMARY CARDIOLOGIST: Dr. Juliann Paresallwood.  PRIMARY ONCOLOGIST: Dr. Sherrlyn HockPandit   CONSULTATIONS IN THE HOSPITAL:  1. Oncology consultation with Dr. Sherrlyn HockPandit.  2. Palliative Care consultation by Dr. Harvie JuniorPhifer.  3. Nephrology consultation by Dr. Mady HaagensenMunsoor Lateef.  :DISCHARGE DIAGNOSES: 1. Acute respiratory failure.  2. Chronic obstructive pulmonary disease exacerbation.  3. Right lower lobe pneumonia.  4. Non-small cell lung cancer.  5. Acute renal failure, likely contrast-induced nephropathy.  6. Paroxysmal atrial fibrillation.  7. Peripheral vascular disease.  8. AAA status post repair.  9. Gastroesophageal reflux disease.   DISCHARGE HOME MEDICATIONS 1. Nephro-Vite 1 tablet p.o. daily.  2. Symbicort 160/4.5 2 puffs b.i.d..  3. Gabapentin 300 mg p.o. daily.  4. ProAir inhaler 2 puffs every 6 hours p.r.n.  5. Ativan 10 mg p.o. daily.  6. Fluoxetine 40 mg p.o. daily.  7. Imdur 30 mg p.o. twice [name] day.  8. Spiriva 18 mcg inhalation daily.  9. Tylenol 500 mg 1 to 2 tablets q.4-6 hours p.r.n. for pain.  10. Aspirin 81 mg p.o. daily.  11. Betamethasone topical gel topically lightly to affected area twice [name] day.  12. EpiPen injection 0.3 mL injectable once as needed for allergic reaction.  13. Ferrous sulfate 325 mg p.o. daily.  14. Flonase nasal spray 2 sprays once [name] day.  15. Loperamide 4 mg tablets first after an episode of diarrhea followed by 2 mg every 6 hours as needed.  16. Nexium 40 mg p.o. b.i.d.  17. Nicotine patch transdermal daily, 14 mg. 18. Polyethylene glycol 17 grams daily as needed for constipation.  19. Pravachol 20 mg p.o. daily for.  20. Silver sulfadiazine 1% topical cream b.i.d.  21. Carbamide peroxide 6.5% otic  solution 5 drops to each ear 3 times [name] day. 22. Tetrahydrozoline 0.05% ophthalmic solution 1 to 2 drops each eye 4 times [name] day.  23. Tums 2 tablets every 2 hours as needed for indigestion.  24. Metoprolol 25 mg p.o. b.i.d.  25. Colace 100 mg p.o. b.i.d.  26. Xanax 0.5 mg every 8 hours p.r.n. for anxiety.  27. Lasix 20 mg p.o. daily.  28. Roxanol 20 mg/mL concentrate 0.25 mL every 2 hours as needed for pain or dyspnea.  29. DuoNebs 3 mL q.6 hours p.r.n. for wheezing.  30. Augmentin 875 mg 1 tablet p.o. bid for 5 days.  31. Prednisone taper over 12 days.  32. Cardizem 240 mg p.o. daily.   DISCHARGE OXYGEN: 4 liters.   DISCHARGE DIET: Low-sodium diet.   DISCHARGE ACTIVITY: As tolerated.   FOLLOWUP INSTRUCTIONS: The patient is being discharged home with Hospice following her and then follow up with Dr. Sherrlyn HockPandit within 1 to 2 weeks.   LABS AND IMAGING STUDIES PRIOR TO DISCHARGE:  Sodium 135, potassium 3.6, chloride 91, bicarb 39, BUN 39, creatinine 1.6. Glucose 173, and calcium 8.4.   WBC 9.9, hemoglobin 10.7, hematocrit 33.7, platelet count is 236.  Abdominal x-ray done showing nonspecific bowel gas pattern. Bibasilar atelectasis is present. Her last chest x-ray was from 08/14/2012 showing worsening CHF, atelectasis, pneumonia at the right lung bases present.   BRIEF HOSPITAL COURSE: Please refer to history and physical done by Dr. Juliene PinaMody on admission, and also an interim summary dictated by Dr.  Richard [employer] on 08/15/2012.   The patient is [name] 74 year old Caucasian female with past medical history significant for coronary artery disease status post End-STEMI, [name] history of non-small cell lung cancer on the right side, hypertension, diabetes, ongoing smoking who was brought in secondary to breathing difficulty and is found to be in acute respiratory failure.   1. Acute on chronic respiratory failure secondary to possible healthcare-acquired pneumonia right lower lobe infiltrates, COPD  exacerbation. She was initially on BiPAP and then changed over to high flow nasal cannula. Palliative Care consultation was requested because of difficulty weaning her off of high flow nasal cannula. She was very tight and wheezing on exam in spite of  [name] week of IV steroids, so she was on very high doses of Solu-Medrol. She did have azithromycin, vancomycin and Zosyn coverage for her healthcare-acquired pneumonia and also nebulizer and also inhalers. She was also diuresed with low dose Lasix. However, because of her initial CT with contrast that she received, she went into acute renal failure. She was slowly weaned off from high flow to 3 to 4 L nasal cannula and her sat were fine on 3 L but she gets anxious and feels she is hypoxic. Her prognosis is poor with her non-small cell lung cancer and worsening COPD. Initially the plan was to send to [facility] but the patient refused as she wanted to go home. Now the patient is being discharged home and Hospice will follow the patient at home. She will continue to follow up with Dr. Sherrlyn Hock and probably might have an outpatient PET study. The patient is being discharged on Augmentin to finish up the antibiotic, slow prednisone taper, and her inhalers for her COPD and pneumonia.   2. Acute renal failure, contrast induced-nephropathy. Lasix was initially stopped and gentle hydration given and creatinine improved up to 1.6 at the time of discharge.   3. Paroxysmal atrial fibrillation. Not [name] good candidate for Coumadin with her cancer. Will continue Cardizem and metoprolol for heart rate control. Her course has been otherwise uneventful in the hospital.   DISCHARGE CONDITION: Guarded with poor long-term prognosis.   DISCHARGE DISPOSITION: Home with Hospice.  CODE STATUS: DO NOT RESUSCITATE as reflected as per discussion with Palliative Care on 08/15/2012.   TIME SPENT ON DISCHARGE: 45 minutes.   ____________________________ Enid Baas,  MD rk:jm D: 08/22/2012 12:10:29 ET T: 08/23/2012 13:46:15 ET JOB#: [id number]  cc: Enid Baas, MD, <Dictator> Sandeep R. Sherrlyn Hock, MD Enid Baas MD ELECTRONICALLY SIGNED Sep 07, 2012 14:21

## 2014-12-19 NOTE — Discharge Summary (Signed)
PATIENT NAME:  Peggy [name], Peggy [name] MR#:  [id number] DATE OF BIRTH:  1940-12-15  DATE OF ADMISSION:  02/21/2012 DATE OF DISCHARGE:  02/27/2012  DISCHARGE DIAGNOSES:  1. Unstable angina, likely due to non ST-elevation myocardial infarction due to underlying coronary artery disease, status post catheterization showing [name] right ostial 80% stenosis. Cardiology recommending outpatient follow-up with [facility] with Dr. Rhona RaiderMonish Patel, on aspirin, beta blocker and nitrates.  2. Non ST-elevation myocardial infarction, outpatient [facility] follow up.  3. Acute on chronic respiratory failure with chronic obstructive pulmonary disease exacerbation requiring oxygen, improving on steroids. Oxygen level stable but does have some subjective shortness of breath which could be due to possible anxiety.  4. Possible lung cancer, status post lung nodule biopsy, pending results. Will require outpatient follow-up with Dr. Sherrlyn HockPandit..  5. Mild acute renal insufficiency, stable.  6. Mild anxiety, on Xanax as needed.   SECONDARY DIAGNOSES:  1. History of congestive heart failure.  2. Dementia.  3. Osteoarthritis.  4. Atrial fibrillation.  5. Peripheral vascular disease. 6. Gastroesophageal reflux disease.  7. Hypertension.  8. Migraine.  9. Chronic obstructive pulmonary disease, on home oxygen. 10. History of secondary hyperparathyroidism.   CONSULTATION:  1. Cardiology, Dr. Juliann Paresallwood.  2. Pulmonary, Dr. Freda MunroSaadat Khan.  3. Physical Therapy.  4. Oncology, Dr. Sherrlyn HockPandit.    LABORATORY, DIAGNOSTIC AND RADIOLOGICAL DATA:  Chest x-ray on July 2nd showed no evidence of pneumothorax, status post percutaneous lung biopsy.  CT-guided lung biopsy on July 2nd for right upper lobe pulmonary nodule.  PET scan on July 1st showed FDG with nodular density within the right lung worrisome for possible neoplastic disease, cannot rule out infectious and/or inflammatory process.  CT scan of the chest with PE protocol on June 27th  showed right upper lobe mass concerning for malignant disease. Mild interstitial edema concerning for congestive heart failure.  Chest x-rays on the 27th of June showed interstitial infiltrate,  edematous versus nonedematous. Atelectasis versus focal infiltrate.  [name] 2D echocardiogram on June 28th  showed normal LV size, no thrombus, ejection fraction more than 55%. Elevated RV systolic pressure 30 to 49 mmHg. Mild valvular aortic stenosis.  Urinalysis on admission was negative.  Intact PTH was elevated with [name] value of 70.0.   HISTORY AND SHORT HOSPITAL COURSE: The patient is [name] 74 year old female with the above-mentioned medical problems, was admitted for acute on chronic respiratory failure thought to be secondary to chronic obstructive pulmonary disease flare. Please see Dr. Jackqulyn LivingsAkuneme's dictated History and Physical for further details. The patient ruled in for non-ST elevation myocardial infarction. She was also found to have some unstable angina. [name] Cardiology consultation was obtained with Dr. Juliann Paresallwood, who recommended cardiac catheterization which was performed and showed right ostial 80% stenosis. During the hospital stay, the patient underwent CT of the chest which showed possible lung mass for which pulmonary consultation was obtained with Dr. Freda MunroSaadat Khan, who recommended PET scan for further evaluation. Also, Oncology consultation was obtained as there was [name] concern for possible lung cancer. Dr. Sherrlyn HockPandit evaluated the patient and recommended CT-guided lung biopsy which was performed as dictated above, the results of which are pending. The patient was working with physical therapy and was slowly improving. She was recommended discharging home with Home Health, per therapist. The patient had mild acute renal insufficiency which seemed to be stable and was found to have some subjective shortness of breath but not much drop in her oxygen levels, which was thought to be due to anxiety, and  was started on  low-dose Xanax as needed.   PERTINENT DISCHARGE PHYSICAL EXAMINATION: VITAL SIGNS: On the date of discharge her vital signs were as follows: Temperature 98.3, heart rate 65 per minute, respirations 18 per minute, blood pressure 131-66 mmHg. She was saturating 97% on 2.5 liters via nasal cannula. CARDIOVASCULAR: S1, S2 normal. No murmurs, rubs or gallops. LUNGS: Decreased breath sounds at the bases. No wheezing, rales, rhonchi or crepitation. ABDOMEN: Soft, benign. NEUROLOGIC: Nonfocal examination. All other physical examination remained at baseline.  DISCHARGE MEDICATIONS:  1. Nephro-Vite with vitamin B complex, C and folic acid once daily.  2. Fluoxetine 20 mg p.o. daily. 3. Nexium 40 mg p.o. b.i.d.  4. Symbicort 2 puffs inhaled b.i.d. 5. Spiriva once daily.  6. Albuterol inhaled t.i.d.  7. Nasonex 2 sprays to each nostril daily.  8. EpiPen as needed. 9. Loratadine 10 mg p.o. daily.  10. Hydralazine 50 mg p.o. t.i.d.  11. Isosorbide mononitrate 30 mg p.o. daily.  12. Bumetanide 0.5 mg p.o. daily as needed.  13. Potassium chloride 20 mEq p.o. daily as needed.  14. Ferrous gluconate 240 mg p.o. daily.  15. Stool softener once daily.  16. Pravastatin 20 mg p.o. at bedtime. 17. Gabapentin 300 mg p.o. at bedtime.  18. Prednisone 60 mg p.o. daily, taper 10 mg daily until finished.  19. Tylenol 650 mg p.o. every 4 hours as needed. 20. Nicotine patch 14 mg transdermal daily. 21. Alprazolam 0.25 mg p.o. t.i.d. as needed.  22. Aspirin 81 mg p.o. daily.  23. Metoprolol 25 mg p.o. daily.   DISCHARGE INSTRUCTIONS AND FOLLOWUP:   1. Diet: Low sodium. 2. Activity: As tolerated.  3. The patient was instructed to follow up with her primary care physician, Dr. Dossie Arbour, in 1 to 2 weeks.  4. She will need follow-up with Dr. Rhona Raider at [facility] in 1 to 2 weeks.  5. She will need follow-up with Dr. Freda Munro from Pulmonary in 4 to 6 weeks.  6. She will need follow-up with Dr. Sherrlyn Hock on   this coming Monday on July 8th at scheduled time at his office.  7. She was set up to get Home Health nursing, physical therapy evaluation and management by Care Management.  8. She will need 2 liters oxygen by nasal cannula continuous at home.    TIME SPENT ON DISCHARGING PATIENT: 55 minutes.    ____________________________ Ellamae Sia. Sherryll Burger, MD vss:cbb D: 02/29/2012 15:59:01 ET T: 03/01/2012 16:06:15 ET JOB#: [id number]  cc: Amaura Authier S. Sherryll Burger, MD, <Dictator> Steele Sizer, MD Dwayne D. Juliann Pares, MD Yevonne Pax, MD Sandeep R. Sherrlyn Hock, MD Rhona Raider, MD at [facility] Ellamae Sia [facility] MD ELECTRONICALLY SIGNED 03/06/2012 22:30

## 2014-12-19 NOTE — Consult Note (Signed)
ONCOLOGY followup note - resting in bed, denies new cough or recurrent chest pain. On [place] O2.no fever. Eating betteralert and oriented, NAD      vitals - afebrile, stable      lungs - b/l diminished BS      Right breast exam negative for definitive mass or axillary adenopathy. Exam performed in presence of a nurse      abd - soft, NT.- platelets 186K, Cr 1.13, PTT [phone]  74 year old female patient with history of multiple medical issues including chronic smoking, chronic obstructive pulmonary disease, coronary artery disease status post stent placement in the past admitted with chest pain suggestive of angina, has elevated cardiac enzymes and cardiology is planning [facility] for [facility] intervention including PCI. CT scan of the chest has also shown 1.9-cm irregular lung nodule. PET scan shows abnormal acitivity in lung nodule raising s/o lung Ca. Per d/w Dr. Juliann Paresallwood patient may undergo needle biopy. She is planned for CT-guided lung mass bx tomorrow. Will also d/w radiology regarding other areas of activity on PET scan. No acute pain issues at this time. Will continue to follow. The patient  was explained the above and is agreeable to this plan.    Electronic Signatures: Izola PricePandit, Leisl Spurrier Raj (MD)  (Signed on 02-Jul-13 07:56)  Authored  Last Updated: 02-Jul-13 07:56 by Izola PricePandit, Asako Saliba Raj (MD)

## 2014-12-19 NOTE — Consult Note (Signed)
PATIENT NAME:  Peggy [name], GASSEN MR#:  [id number] DATE OF BIRTH:  Feb 14, 1941  DATE OF CONSULTATION:  02/24/2012  REFERRING PHYSICIAN:   CONSULTING PHYSICIAN:  Yevonne Pax, MD  HISTORY: This is a 74 year old female who has past medical history significant for congestive heart failure, AAA repair. She presents to the hospital because she was having some chest pain. She says it started about four hours before coming in. She also is a smoker and continued to smoke until the day that she was admitted to the hospital. When she was seen in the Emergency Room, she was treated with aspirin and nitroglycerin. Cardiology consultation was called and the patient had initially been started on heparin. The patient also does admit to having some hemoptysis. The patient had a CT scan of the chest done and it showed a nodule in the lung. With the findings of the nodule, pulmonary consultation was called to assess. She says that she has been losing some weight. She has, as already mentioned, been a smoker. She has been more short of breath.   PAST MEDICAL HISTORY:  1. Congestive heart failure.  2. Dementia. 3. Osteoarthritis. 4. Atrial fibrillation. 5. Peripheral vascular disease.  6. Gastroesophageal reflux disease.  7. Hypertension. 8. Migraines. 9. Chronic obstructive pulmonary disease.   PAST SURGICAL HISTORY:  1. AAA repair in 2011. 2. History of hemorrhoidectomy. 3. Cardiac stent placement. 4. Hysterectomy. 5. Appendectomy. 6. Toe operations.   FAMILY HISTORY: Positive for coronary artery disease and diabetes as well as malignancy.   SOCIAL HISTORY: As mentioned, she has a 2 pack a day smoking history and has smoked for probably greater than 50 years. The patient says that she does not have any alcohol or drug use and she used to work in the [employer].   MEDICATIONS: Reviewed on the electronic medical record.   ALLERGIES: Negative.   REVIEW OF SYSTEMS: A twelve-point review of system was done  and is negative other than what is noted above in the history of present illness.   PHYSICAL EXAMINATION:    VITAL SIGNS: Temperature 97.9, pulse 96, respiratory rate 18, blood pressure 142/73, saturations 96% on about 2 liters O2.   NECK: Neck appeared to be supple. There is no JVD. No adenopathy. No thyromegaly.   CHEST: Coarse breath sounds, a few rhonchi are noted.   CARDIOVASCULAR: S1, S2 is normal. Regular rhythm. No gallop or rub.   ABDOMEN: Soft, nontender.   EXTREMITIES: Without cyanosis or clubbing. Pulses are equal. There was no edema noted.   NEUROLOGIC: She was awake and alert, moving all four extremities. Gait was not checked.   SKIN: No acute rashes.   MUSCULOSKELETAL: Without any acute synovitis.   PSYCH: She had appropriate affect and did not appear to be depressed or anxious.   LABORATORY, RADIOLOGICAL AND DIAGNOSTIC DATA: White count 6.7, hemoglobin 10.6, hematocrit 32.2. BUN 13, creatinine 1.23, sodium 140, potassium 3.8. CT scan of the chest was personally reviewed by me, shows presence of a right upper lobe nodule mass which has irregular margins and looked very much suspicious for malignancy. The patient also had EKG which showed normal sinus with right bundle branch block.   IMPRESSION/RECOMMENDATIONS:  Pulmonary nodule. The appearance of the nodule is irregular with spiculation and appears to be peripherally located and so best approach may very well be a CT-guided needle biopsy in the situation. The patient and I have discussed risks and benefits and she is agreeable for the procedure. She should be  off all anticoagulants prior to the study being done. Will discuss with her primary care. The patient states that she has been seen by Dr. Meredeth IdeFleming in the past and he should be able to take over the care when he returns tomorrow.   ____________________________ Yevonne PaxSaadat A. Khan, MD sak:ap D: 02/24/2012 13:06:05 ET T: 02/24/2012 13:48:31 ET JOB#: [id number]  cc: Yevonne PaxSaadat  A. Khan, MD, <Dictator> Yevonne PaxSAADAT A KHAN MD ELECTRONICALLY SIGNED 03/14/2012 13:14

## 2014-12-19 NOTE — Consult Note (Signed)
PATIENT NAME:  Peggy [name], Peggy [name] MR#:  [id number] DATE OF BIRTH:  Jul 31, 1941  DATE OF CONSULTATION:  02/21/2012  REFERRING PHYSICIAN:   CONSULTING PHYSICIAN:  Esgar Barnick D. Juliann Paresallwood, MD  PRIMARY CARE PHYSICIAN: Dr. Dossie Arbourrissman, MD   INDICATION: Chest pain, angina, possible non-Q-wave myocardial infarction.   HISTORY OF PRESENT ILLNESS: Peggy [name] is [name] 74 year old white female with multiple medical problems including congestive heart failure, abdominal aortic aneurysm repair, peripheral vascular disease, renal insufficiency, early dementia, atrial fibrillation, reflux,  hypertension, COPD, and smoking who presents with dull chest pain symptoms of the anterior chest radiating to her neck. She had some nausea and shortness of breath. No vomiting. No syncope. She denied any recent trauma. She came to the Emergency Room and was given nitroglycerin and aspirin with not much relief. She's had several episodes of palpitation and pain with deep inspiration. Because of recurrent symptoms she was admitted for further evaluation, placed on heparin for further management.   REVIEW OF SYSTEMS: No blackout spells or syncope. No nausea or vomiting. No fever. No chills. No sweats. No weight loss. No weight gain. No hemoptysis or hematemesis. No bright red blood per rectum.   PAST MEDICAL HISTORY:  1. Congestive heart failure.  2. Peripheral vascular disease. 3. Renal failure. 4. Early dementia.  5. Osteoarthritis.  6. Atrial fibrillation. 7. Reflux.  8. Hypertension.  9. Migraines.  10. Chronic obstructive pulmonary disease. 11. Secondary hyperparathyroidism.  12. Smoking. 13. Angina. 14. Coronary artery disease.   PAST SURGICAL HISTORY:   1. AV fistula in arm. 2. Abdominal aortic aneurysm repair.  3. Hemorrhoidectomy.  4. Peripheral stents.  5. Hysterectomy.  6. Appendectomy. 7. BTL. 8. Placement of right IJ.  9. Right toe operation.   FAMILY HISTORY: Coronary disease, diabetes, cancer.   SOCIAL  HISTORY: Lives alone in assisted living. Has [name] nurse. Still smokes. No alcohol consumption.   MEDICATIONS:  1. Albuterol 2.5 3 times [name] day.  2. Amlodipine 5 mg daily.  3. Bumex 0.5 daily.  4. EpiPen. 5. Iron. 6. Fluoxetine 20 mg daily. 7. Gabapentin 300 mg at bedtime.  8. Hydralazine 50 mg 3 times [name] day.  9. Imdur 30 mg daily.  10. Loratadine 10 mg daily.  11. Nasonex 50 mcg 2 puffs daily.  12. Nephro-Vite daily.  13. Nexium 40 mg daily.  14. Potassium chloride 20 mEq daily.  15. Pravastatin 20 mg at bedtime.  16. Spiriva 18 mcg [name] day.  17. Stool softener.  18. Symbicort 160 2 puffs twice [name] day.   ALLERGIES: None.   PHYSICAL EXAMINATION:   VITAL SIGNS: Blood pressure 130/60, pulse 75, respiratory rate 18. She is on 2 liters at 96%. Afebrile.   HEENT: Normocephalic, atraumatic. Pupils equal and reactive to light.   NECK: Supple. No JVD, bruits, or adenopathy.   LUNGS: Clear to auscultation and percussion with rhonchi. No rales. No significant wheezing. Adequate air movement at the time.   HEART: Distant. Regular rate and rhythm. Systolic ejection murmur at the apex. PMI nondisplaced.   ABDOMEN: Benign.   EXTREMITIES: Within normal limits with decreased pulses bilaterally.   NEUROLOGIC: Intact.   SKIN: Normal.   LABORATORY, DIAGNOSTIC, AND RADIOLOGICAL DATA: EKG atrial fibrillation, rapid ventricular response at the time but now is controlled. Right bundle branch block. Left anterior fascicular block.   Chest x-ray interstitial markings, mild heart failure.   White count 7, hemoglobin 12, platelet count 230. Chemistries creatinine 1.2, baseline 1.6, glucose 120, potassium 2.6, calcium 8.6. BNP 1300.  LFTs normal. CK 144. Troponin 0.89. INR was negative.   ASSESSMENT:  1. Unstable angina, angina, probable coronary artery disease. 2. Chest pain. 3. Paroxysmal atrial fibrillation. 4. Chronic obstructive pulmonary disease. 5. Congestive heart  failure. 6. Dementia. 7. Peripheral vascular disease.  8. Reflux.  9. Hypertension.  10. Smoking.  PLAN:  1. Agree with admit.  2. Rule out for myocardial infarction.  3. Follow cardiac enzymes. Troponin is elevated possibly related to non-Q-wave myocardial infarction or demand ischemia. Will consider cardiac cath. Will try to hydrate prior to cath.  4. Place on heparin. I'm concerned with her peripheral vascular disease and all other multiple medical problems that coronary disease is very likely.  5. Continue respiratory support.  6. Continue inhalers. 7. May need steroids. 8. Low dose Lasix.  9. Follow-up renal insufficiency.  10. Continue reflux medication. The patient should probably be on [name] statin.  11. Advised the patient to quit smoking. 12. Consider echocardiogram.  13. Continue Pravachol for cholesterol therapy.  14. Continue iron for anemia.  15. Base further evaluation on results of the cath.   ____________________________ Bobbie Stack. Juliann Pares, MD ddc:drc D: 02/22/2012 08:21:38 ET T: 02/22/2012 10:13:33 ET JOB#: [id number]  cc: Smrithi Pigford D. Juliann Pares, MD, <Dictator> Alwyn Pea MD ELECTRONICALLY SIGNED 03/18/2012 9:14

## 2014-12-19 NOTE — Consult Note (Signed)
Chief Complaint:   Subjective/Chief Complaint She still has some neck and shoulder discomfort on the right. R groin ok.   VITAL SIGNS/ANCILLARY NOTES: **Vital Signs.:   28-Jun-13 12:51   Vital Signs Type Routine   Temperature Temperature (F) 98.3   Celsius 36.8   Temperature Source Oral   Pulse Pulse 70   Respirations Respirations 21   Systolic BP Systolic BP 875   Diastolic BP (mmHg) Diastolic BP (mmHg) 52   Mean BP 73   Pulse Ox % Pulse Ox % 91   Pulse Ox Activity Level  At rest   Oxygen Delivery 2.5  L  *Intake and Output.:   Shift 28-Jun-13 15:00   Grand Totals Intake:   Output:  400    Net:  -400 24 Hr.:  -400   Urine ml     Out:  400   Length of Stay Totals Intake:   Output:  1100    Net:  -1100   Brief Assessment:   Cardiac Regular  murmur present  + carotid bruits  -- LE edema  -- JVD  --Gallop    Respiratory normal resp effort  clear BS  rhonchi    Gastrointestinal Normal    Gastrointestinal details normal Soft  Nontender  Nondistended   Lab Results: Routine Chem:  28-Jun-13 03:35    Glucose, Serum 85   BUN 11   Creatinine (comp) 1.28   Sodium, Serum 139   Potassium, Serum 4.3   Chloride, Serum 102   CO2, Serum  33   Calcium (Total), Serum  8.1   Anion Gap  4   Osmolality (calc) 276   eGFR (African American)  49   eGFR (Non-African American)  43 (eGFR values <47m/min/1.73 m2 may be an indication of chronic kidney disease (CKD). Calculated eGFR is useful in patients with stable renal function. The eGFR calculation will not be reliable in acutely ill patients when serum creatinine is changing rapidly. It is not useful in  patients on dialysis. The eGFR calculation may not be applicable to patients at the low and high extremes of body sizes, pregnant women, and vegetarians.)   Result Comment aptt - RESULTS VERIFIED BY REPEAT TESTING.  - NOTIFIED OF CRITICAL VALUE  - mpg c/ latoya keene @ 0410 02/22/12  - READ-BACK PROCESS PERFORMED.  Result(s)  reported on 22 Feb 2012 at 04:11AM.   Magnesium, Serum 2.0 (1.8-2.4 THERAPEUTIC RANGE: 4-7 mg/dL TOXIC: > 10 mg/dL  -----------------------)   Cholesterol, Serum 129   Triglycerides, Serum 40   HDL (INHOUSE)  63   VLDL Cholesterol Calculated 8   LDL Cholesterol Calculated 58 (Result(s) reported on 22 Feb 2012 at 04:00AM.)  Routine Coag:  28-Jun-13 03:35    Activated PTT (APTT)  > 160.0 (A HCT value >55% may artifactually increase the APTT. In one study, the increase was an average of 19%. Reference: "Effect on Routine and Special Coagulation Testing Values of Citrate Anticoagulant Adjustment in Patients with High HCT Values." American Journal of Clinical Pathology 2006;126:400-405.)    12:22    Activated PTT (APTT)  38.4 (A HCT value >55% may artifactually increase the APTT. In one study, the increase was an average of 19%. Reference: "Effect on Routine and Special Coagulation Testing Values of Citrate Anticoagulant Adjustment in Patients with High HCT Values." American Journal of Clinical Pathology 2006;126:400-405.)  Routine Hem:  28-Jun-13 03:35    WBC (CBC) 6.7   RBC (CBC)  3.63   Hemoglobin (CBC)  10.6  Hematocrit (CBC)  32.2   Platelet Count (CBC) 186   MCV 89   MCH 29.2   MCHC 32.9   RDW  14.8   Neutrophil % 77.9   Lymphocyte % 10.2   Monocyte % 9.1   Eosinophil % 2.0   Basophil % 0.8   Neutrophil # 5.2   Lymphocyte #  0.7   Monocyte # 0.6   Eosinophil # 0.1   Basophil # 0.1 (Result(s) reported on 22 Feb 2012 at 03:46AM.)   Radiology Results: XRay:    27-Jun-13 06:14, Chest Portable Single View   Chest Portable Single View    REASON FOR EXAM:    chest pain  COMMENTS:       PROCEDURE: DXR - DXR PORTABLE CHEST SINGLE VIEW  - Feb 21 2012  6:14AM     RESULT:     Frontal view of the chest is performed.     The patient has taken a shallow inspiration. There is prominence of the  interstitial markings. The study is compared to a previous study dated    11/30/2009. The cardiac silhouette is enlarged indicative of cardiomegaly.   An area of increased density with a nodular appearance projects along the   periphery of the right hemithorax. The visualized bony skeleton is   grossly unremarkable.   IMPRESSION:     1.  Interstitial infiltrates, edematous versus nonedematous.  2.  Atelectasis versus focal infiltrate along the periphery of the right   hemithorax. Repeat evaluationwith AP and lateral views is recommended. A   nodule within this area cannot be excluded.       Thank you for this opportunity to contribute to the care of your patient.           Verified By: Mikki Santee, M.D., MD  Cardiology:    27-Jun-13 17:24, ECG   Ventricular Rate 62   Atrial Rate 62   P-R Interval 154   QRS Duration 142   QT 456   QTc 462   P Axis 64   R Axis -28   T Axis 15   ECG interpretation    Normal sinus rhythm  Right bundle branch block  Abnormal ECG  When compared with ECG of 07-Oct-2010 22:45,  T wave inversion no longer evident in Anterior leads  ----------unconfirmed----------  Confirmed by OVERREAD, NOT ([id number]), editor PEARSON, BARBARA ([id number]) on 02/22/2012 8:00:29 AM   ECG   CT:    27-Jun-13 12:57, CT Chest for Pulm Embolism With Contrast   CT Chest for Pulm Embolism With Contrast    REASON FOR EXAM:    elevated ddimer,chest pain,sob  COMMENTS:       PROCEDURE: CT  - CT CHEST (FOR PE) W  - Feb 21 2012 12:57PM     RESULT:     TECHNIQUE: CT of the chest is performed with 75 ml of Isovue-370   iodinated intravenous contrast. Images are reconstructed at 3.0 mm slice   thickness in the axial plane.     There is no previous exam for comparison.    FINDINGS: There is an irregular soft tissue mass measuring 1.19 x 1.80 cm   on image 41 of the mediastinal windows in the right upper lobe. This is     concerning for malignant disease. There is evidence of atrophy of the   right kidney on the inferior images in the upper abdomen.  There appears   to be a left renal cyst. There is a moderately  large, hiatal hernia   present with some prominence of the distal esophagus which could be   secondary to reflux given the air-fluid level at the level of the AP   window. It is difficult to exclude subcarinal adenopathy but well defined   nodes are not evident. There is no paratracheal or APwindow adenopathy   evident. Oncologic consultation for possible right upper lobe malignancy   is recommended    No central airway obstructing mass is seen. There is significant   respiratory motion artifact. There is mild diffuse pulmonary interstitial   thickening which could be secondary to edema from congestive failure or   pneumonitis. The heart is enlarged. The thoracic aorta appears normal in   caliber with atherosclerotic disease present. There is no dissection.     There is no pulmonary arterial filling defect to suggest pulmonary   embolism. The thyroid lobes appear grossly normal. The axillary regions   appear to be unremarkable. Degenerative bony changes are noted in the   spine. There is no compression fracture evident.    IMPRESSION:     1.  Right upper lobe mass concerning for underlying malignant disease.   Consider Oncologic consultation.  2.  Mild interstitial edema concerning for congestive heart failure.   Close monitoring of this is recommended since the patient received 75 ml   of fluid for the contrast bolus.  3.  The patient has decreased renal function which should be monitored   closely following contrast administration, especially given limited   ability to give an IV fluid bolus because of interstitial edema.  Dictation Site: 1    Thank you for this opportunity to contribute to the care of your patient.           Verified By: Sundra Aland, M.D., MD   Assessment/Plan:  Invasive Device Daily Assessment of Necessity:   Does the patient currently have any of the following indwelling devices? none    Assessment/Plan:   Assessment IMP Angina HTN Hyperchol CAD Smoking PVD CRI Lung Mass    Plan PLAN Tele Imdur po ASA 81 daily B-blockers po for angina and HTN Hydration for CRI Statin therapy for chol/Lipids Ostial RCA 90% We will refer to Dr Jerilynn Som at [facility] for possible PCI Advise to quit smoking W/U lung mass . No Plavix until after lung Bx   Electronic Signatures: Lujean Amel D (MD)  (Signed 29-Jun-13 10:45)  Authored: Chief Complaint, VITAL SIGNS/ANCILLARY NOTES, Brief Assessment, Lab Results, Radiology Results, Assessment/Plan   Last Updated: 29-Jun-13 10:45 by Lujean Amel D (MD)

## 2014-12-19 NOTE — Consult Note (Signed)
PATIENT NAME:  Peggy [name], Peggy [name] MR#:  [id number] DATE OF BIRTH:  06-09-1941  DATE OF CONSULTATION:  02/23/2012  REFERRING PHYSICIAN:  Dr. Luberta [name]  CONSULTING PHYSICIAN:  Peggy Wilmot R. Sherrlyn HockPandit, MD  REASON FOR CONSULTATION: 74 year old female, heavy smoker, with right lung mass on CT scan.   HISTORY OF PRESENT ILLNESS: The patient is [name] 74 year old female who was admitted to the hospital on 06/27 with complaints of chest pain that woke her up from sleep, 8/10, and felt like [name] weight on her chest radiating up to her neck. The patient had work-up done including being seen by cardiologist Dr. Juliann [name] and felt to have angina, and has been started on aspirin and Betapace with plans to [facility]. The patient, however, also had [name] CT scan of chest which showed an irregular soft tissue mass measuring 1.8 x 1.19 cm in the right upper lobe concerning for malignant disease. There was no definite evidence of mediastinal adenopathy. The patient clinically denies any unintentional weight loss, appetite has been steady. She states that she has been fairly physically active up until progressive symptoms prior to admission. No hemoptysis or chest pain at this time.   PAST MEDICAL HISTORY/PAST SURGICAL HISTORY:  1. Chronic smoking. 2. History of congestive heart failure.  3. 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 Previously was on dialysis but now has been off dialysis for more than one year.  4. Status post AV fistula placement left arm.  5. History of dementia. 6. Osteoarthritis.  7. Atrial fibrillation. 8. Peripheral vascular disease.  9. Gastroesophageal reflux disease. 10. Hypertension. 11. Migraine. 12. Chronic obstructive pulmonary disease on home oxygen.  13. History of secondary hyperparathyroidism.  14. Hemorrhoidectomy.  15. Cardiac stent placement.  16. Hysterectomy.  17. Appendectomy.  18. Bilateral  tubal ligation. 19. Right toe operation.   FAMILY HISTORY: Remarkable for diabetes and heart disease, siblings with cancer, type unknown to the patient.   SOCIAL HISTORY: Placed in assisted living, has significant smoking history, close to 100 pack-year history. Currently smokes half pack per day. Denies alcohol usage.   ALLERGIES: No known drug allergies.   HOME MEDICATIONS:  1. Bumex 0.5 mg daily p.r.n. 2. Amlodipine 5 mg daily. 3. Ferrous gluconate 240 mg daily. 4. EpiPen injectable p.r.n. 5. Albuterol 2.5 mg 3 times daily.  6. Fluoxetine 20 mg daily.  7. Gabapentin 300 mg at bedtime.  8. Hydralazine 50 mg t.i.d. 9. Isosorbide mononitrate 30 mg extended release q. [name].m.  10. Loratadine 10 mg daily.  11. Nasonex 50 mcg 2 puffs daily.  12. Nephro-Vite, vitamin B complex with C and folic acid 1 tablet daily.  13. Potassium chloride 20 mEq daily p.r.n.  14. Pravastatin 20 mg at bedtime.  15. Nexium 40 mg b.i.d. 16. Spiriva 18 mcg daily.  17. Stool softener once daily.  18. Symbicort inhaler 160 mcg 2 puffs b.i.d.   REVIEW OF SYSTEMS: CONSTITUTIONAL: Currently feels better since hospitalization. Has dyspnea on exertion which is chronic. No fevers or chills. HEENT: Currently denies headaches, dizziness, epistaxis, ear or jaw pain. CARDIAC: Had chest pain on admission. No orthopnea, paroxysmal nocturnal dyspnea, or palpitations. LUNGS: As in history of present illness. GI: No nausea, vomiting, diarrhea, bright red blood in stools or melena. GU: No dysuria or hematuria.  SKIN: No new rashes or pruritus. HEMATOLOGIC: Denies obvious bleeding symptoms.   MUSCULOSKELETAL: Has chronic arthritis, denies new bone pains. NEUROLOGIC:  No new focal weakness, seizures, or loss of consciousness. ENDOCRINE: No polyuria or polydipsia. Appetite is steady.   PHYSICAL EXAMINATION:  GENERAL: The patient is an elderly, moderately built and nourished individual, alert and oriented, in no acute distress at  rest. No icterus.   VITAL SIGNS: Temperature 98.1, pulse 73, respiratory rate 20, blood pressure 163/70, 95% on 2.5 liters.   HEENT: Normocephalic, atraumatic. Extraocular movements intact. Sclerae anicteric. No oral thrush.   NECK: Negative for lymphadenopathy.   CARDIOVASCULAR: S1, S2, regular rate and rhythm.   LUNGS: Lungs show bilateral diminished breath sounds overall. No crepitations or rhonchi noted.   ABDOMEN: Soft, nontender. No hepatosplenomegaly clinically.   EXTREMITIES: No major edema or cyanosis.   SKIN: No generalized rashes or major bruising.   NEUROLOGIC: Cranial nerves are intact, moves all extremities spontaneously.   MUSCULOSKELETAL: No obvious joint deformity or swelling.   LABORATORY, DIAGNOSTIC, AND RADIOLOGICAL DATA: Creatinine 1.23, potassium 3.8, calcium 8.5, PTT 38.4, hemoglobin 10.6, WBC 6700, platelets 186. Troponin I elevated at 1.73. CK-MB elevated at 7.5. CK total 144.   IMPRESSION AND RECOMMENDATIONS: 74 year old female patient with history of multiple medical issues as described above including chronic smoking, chronic obstructive pulmonary disease, coronary artery disease status post stent placement in the past admitted with chest pain suggestive of angina, has elevated cardiac enzymes and cardiology is planning referral to [facility] for further intervention including PCI. CT scan of the chest has also shown 1.9-cm irregular lung nodule, which raises suspicion of underlying lung cancer given extensive smoking history. The patient also needs further work-up for this issue. We will order [name] PET scan for lung cancer diagnosis and staging; this can likely be done on Monday, July 01. Based on findings on PET scan, we will then make [name] decision if lung nodule is likely malignant and then may need to pursue biopsy or surgical intervention as indicated. We will need to discuss with her cardiologist at that time regarding their plan for any PCI/cardiac intervention and  also since the patient may need aspirin or other anticoagulants from cardiac standpoint. The patient otherwise has no acute pain issues at this time. Appetite is good. We will continue to follow. The patient  was explained the above and is agreeable to this plan. We will continue to follow.   Thank you for the referral. Please feel free to contact me if any additional questions.    ____________________________ Maren Reamer Sherrlyn Hock, MD srp:bjt D: 02/24/2012 17:31:33 ET T: 02/25/2012 06:12:32 ET JOB#: [id number]  cc: Darryll Capers R. Sherrlyn Hock, MD, <Dictator> Wille Celeste MD ELECTRONICALLY SIGNED 02/25/2012 10:13

## 2014-12-19 NOTE — H&P (Signed)
PATIENT NAME:  Peggy [name], Peggy [name] MR#:  [id number] DATE OF BIRTH:  1941/01/01  DATE OF ADMISSION:  02/21/2012  PRIMARY CARE PHYSICIAN: Dr. Dossie Arbourrissman CARDIOLOGIST: Dr. Juliann Paresallwood  ER PHYSICIAN: Dr. Jens SomWiegand ADMITTING PHYSICIAN: Dr. Tilda FrancoAkuneme  PRESENTING COMPLAINT: Chest pain.   HISTORY OF PRESENT ILLNESS: Patient is [name] 74 year old lady who presented to the hospital with complaint of chest pain which has been ongoing since four hours prior to presentation to the hospital. Patient states pain woke up from sleep, intensity was 8/10, sharp to dull in nature, like [name] weight on her chest, radiating up to her neck, associated with nausea, shortness of breath. No vomiting. No loss of consciousness. No PND, orthopnea but has some mild pedal edema. Denies any recent change in her medication. Denies any long distance travel or sick contacts. No rash on her chest. No history of trauma. For this she presented to the Emergency Room where she received nitroglycerin, aspirin with not much relief of the pain. Patient states she also had episodes of palpitations. Pain worsened by deep inspiration or movement. Also admits to prior episodes of chest pain with feeds but no loss of consciousness. For this she was seen in the Emergency Room and referred to the hospitalist after she was discussed with the cardiologist, Dr. Juliann Paresallwood, who recommended admit the patient and starting on heparin. Of note, patient had recurrent chest pain over the last one week and occasional cough which is nonproductive of any sputum but denies any fever. On arrival in the hospital patient was noted to have atrial fibrillation with rapid ventricular response with heart rate 118. Her heart rate has since returned back to normal, at 75.  PAST MEDICAL HISTORY:  1. History of congestive heart failure.  2. 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 Was previously on dialysis for this but kidneys  subsequently recovered, has been off dialysis for more than one year. Had an AV fistula placed for dialysis in the left arm.  3. History of dementia.  4. Osteoarthritis.  5. Atrial fibrillation. 6. Peripheral vascular disease.  7. Gastroesophageal reflux disease.  8. Hypertension.  9. Migraine. 10. Chronic obstructive pulmonary disease on home oxygen.  11. History of secondary hyperparathyroidism.   PAST SURGICAL HISTORY:  1. AV fistula left arm. 2. History of AAA repair March 2011.  3. History of hemorrhoidectomy.  4. Cardiac stent.  5. Hysterectomy.  6. Appendectomy. 7. BTL. 8. Placement right IJ with removal later.  9. Right toe operation.    SOCIAL HISTORY: [facility] lives alone in [name] assisted living [facility]. Has [name] nurse who comes to see her frequently. History of tobacco misuse; has 100 pack-year history of tobacco use. Currently smoking half pack per day. No other recreational drug use.   FAMILY HISTORY: Positive for coronary disease in both parents. Diabetes in the mother. Cancer in the siblings.   ALLERGIES: No known drug allergies.  HOME MEDICATIONS:  1. Albuterol 2.5 mg 3 times daily.  2. Amlodipine 5 mg once [name] day. 3. Bumex 0.5 mg daily p.r.n.  4. EpiPen injectable p.r.n.  5. Ferrous gluconate 240 mg once daily.  6. Fluoxetine 20 mg once [name] day.  7. Gabapentin 300 mg at bedtime.  8. Hydralazine 50 mg 3 times [name] day. 9. Isosorbide mononitrate 30 mg extended release once [name] day in the morning. 10. Loratadine 10 mg daily.  11. Nasonex 50 mcg 2 puffs daily.  12. Nephro-Vite vitamin B complex with  C and folic acid 1 tablet daily.  13. Nexium 40 mg twice daily.  14. Potassium chloride 20 mEq daily p.r.n.  15. Pravastatin 20 mg at bedtime.  16. Spiriva 18 mcg once [name] day.  17. Stool softener once [name] day.  18. Symbicort inhaler 160 mcg 2 puffs twice [name] day.   PHYSICAL EXAMINATION:  VITAL SIGNS: Temperature 99.2, pulse on arrival 106 now is down to 75, respiratory rate on  arrival 20, blood pressure 140/66 on arrival now is 124/60, oxygen saturation 96% on 2 liters nasal cannula.   GENERAL: Elderly lady, lying on the gurney, awake, alert, oriented to time, place, and person, in no overt distress.   HEENT: Atraumatic, normocephalic. Pupils equal, reactive to light and accommodation. Extraocular movement intact. Mucous membranes pink, moist.   NECK: Supple. No JV distention.   CHEST: Decreased air entry in the bases.   HEART: Regular rate and rhythm. Has [name] systolic ejection murmur in the aortic region 3/6. Tenderness generalized chest wall.   ABDOMEN: Full, moves with respiration. Bowel sounds normoactive. No organomegaly. No abdominal tenderness.   EXTREMITIES: Trace edema left lower extremity. No deformity. Peripheral pulses mildly palpable.   NEUROLOGICAL: Cranial nerves II through XII grossly intact. No focal motor or sensory deficit.   PSYCH: Affect appropriate to situation.   LABORATORY, DIAGNOSTIC, AND RADIOLOGICAL DATA: EKG showed atrial fibrillation with RVR, rate of 118, right bundle branch block, left anterior fascicular block. Chest x-ray showed mild interstitial markings suggestive of congestive heart failure. No obvious infiltrates. CBC: White count 7, hemoglobin 12, platelets 230. Chemistry unremarkable except for elevated bicarbonate of 33. Creatinine 1.2, down from 1.6 from two weeks ago. Glucose 120, potassium 3.6, calcium 8.6, BNP 1344. LFTs normal. CK 144. Troponin is elevated at 0.89. INR 1.1, PT 14.   IMPRESSION:  1. Chest pain, rule out ACS. Other differentials include pulmonary embolus versus arrhythmia versus costochondritis.  2. Transient atrial fibrillation with RVR, now resolved, likely exacerbated by anxiety during the acute episode.  3. Chronic obstructive pulmonary disease with tobacco misuse on home oxygen.  4. Congestive heart failure, stable.  5. Dementia, stable.  6. Peripheral vascular disease, status post AAA repair,  stable. Vascular surgery follow-up visit in one week's time.  7. Gastroesophageal reflux disease, stable.  8. Hypertension, stable.   PLAN:  1. Admit to telemetry unit. Start patient on heparin infusion. Serial cardiac enzymes, TSH, magnesium, fasting lipid profile. Aspirin, nitroglycerin p.r.n. morphine. Telemonitoring. Cardiology consult by Dr. Juliann Pares. Check d-dimer STAT.  2. Respiratory support with oxygen, nebulizer p.r.n.  3. GI prophylaxis with Protonix. Deep vein thrombosis prophylaxis with heparin.  4. Resume rest of outpatient medications, adjust as needed.  5. CODE STATUS: FULL CODE.   TOTAL PATIENT CARE TIME: 50 minutes.   ____________________________ Floy Sabina Tilda Franco, MD mia:cms D: 02/21/2012 08:52:14 ET T: 02/21/2012 09:16:43 ET JOB#: [id number]  cc: Jairy Angulo I. Tilda Franco, MD, <Dictator> Steele Sizer, MD  Margaret Pyle MD ELECTRONICALLY SIGNED 02/22/2012 1:49
